# Patient Record
Sex: Male | Born: 1950
Health system: Southern US, Community
[De-identification: ages and names within clinical notes are randomized; demographics above are authoritative.]

## PROBLEM LIST (undated history)

## (undated) DIAGNOSIS — R55 Syncope and collapse: Secondary | ICD-10-CM

## (undated) DIAGNOSIS — E785 Hyperlipidemia, unspecified: Secondary | ICD-10-CM

## (undated) DIAGNOSIS — I10 Essential (primary) hypertension: Secondary | ICD-10-CM

## (undated) HISTORY — DX: Essential (primary) hypertension: I10

## (undated) HISTORY — DX: Syncope and collapse: R55

## (undated) HISTORY — DX: Hyperlipidemia, unspecified: E78.5

---

## 2005-10-30 ENCOUNTER — Other Ambulatory Visit: Admission: RE | Admit: 2005-10-30 | Discharge: 2005-10-30 | Payer: Self-pay | Admitting: Family Medicine

## 2011-01-14 ENCOUNTER — Other Ambulatory Visit: Payer: Self-pay | Admitting: Family Medicine

## 2011-01-14 DIAGNOSIS — E041 Nontoxic single thyroid nodule: Secondary | ICD-10-CM

## 2011-01-16 ENCOUNTER — Ambulatory Visit
Admission: RE | Admit: 2011-01-16 | Discharge: 2011-01-16 | Disposition: A | Discharge status: Home / Self Care | Payer: 59 | Source: Ambulatory Visit | Attending: Family Medicine | Admitting: Family Medicine

## 2011-01-16 DIAGNOSIS — E041 Nontoxic single thyroid nodule: Secondary | ICD-10-CM

## 2014-12-11 ENCOUNTER — Ambulatory Visit
Admission: RE | Admit: 2014-12-11 | Discharge: 2014-12-11 | Disposition: A | Discharge status: Home / Self Care | Payer: Managed Care, Other (non HMO) | Source: Ambulatory Visit | Attending: Sports Medicine | Admitting: Sports Medicine

## 2014-12-11 ENCOUNTER — Ambulatory Visit (INDEPENDENT_AMBULATORY_CARE_PROVIDER_SITE_OTHER): Payer: Managed Care, Other (non HMO) | Admitting: Sports Medicine

## 2014-12-11 ENCOUNTER — Encounter: Payer: Self-pay | Admitting: Sports Medicine

## 2014-12-11 VITALS — BP 144/83 | Ht 72.0 in | Wt 168.0 lb

## 2014-12-11 DIAGNOSIS — M25571 Pain in right ankle and joints of right foot: Secondary | ICD-10-CM | POA: Diagnosis not present

## 2014-12-11 DIAGNOSIS — M25572 Pain in left ankle and joints of left foot: Secondary | ICD-10-CM

## 2014-12-11 MED ORDER — NITROGLYCERIN 0.2 MG/HR TD PT24: MEDICATED_PATCH | TRANSDERMAL | Status: DC
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
## 2015-01-02 ENCOUNTER — Ambulatory Visit (INDEPENDENT_AMBULATORY_CARE_PROVIDER_SITE_OTHER): Payer: Managed Care, Other (non HMO) | Admitting: Sports Medicine

## 2015-01-02 ENCOUNTER — Encounter: Payer: Self-pay | Admitting: Sports Medicine

## 2015-01-02 VITALS — BP 126/73 | HR 56 | Ht 72.0 in | Wt 168.0 lb

## 2015-01-02 DIAGNOSIS — S86312D Strain of muscle(s) and tendon(s) of peroneal muscle group at lower leg level, left leg, subsequent encounter: Secondary | ICD-10-CM | POA: Diagnosis not present

## 2015-01-02 NOTE — Progress Notes (Signed)
   Subjective:    Patient ID: Richard Hicks, male    DOB: 07-Apr-1951, 64 y.o.   MRN: [id number]  HPI   Patient comes in today for follow-up on a left ankle peroneal tendon strain. Overall his symptoms have not worsened. He has noticed some benefit with the body helix compression sleeve as well as with the lateral post. He has been using his topical nitroglycerin and tolerating it without difficulty. He does admit that he has not been doing as much running as he would like due to reasons other than foot pain but overall feels like he is improving.    Review of Systems     Objective:   Physical Exam Well-developed, well-nourished. No acute distress  Left ankle: There is still slight tenderness to palpation at the distal peroneal tendon. No soft tissue swelling. Mild reproducible pain with passive inversion of the foot and ankle. Neurovascularly intact distally.  Prior x-rays of the left ankle were unremarkable. No accessory bone seen.       Assessment & Plan:  Left foot pain secondary to peroneal tendon strain.  Reassurance regarding his x-rays. I would like for him to continue with his body helix compression sleeve and I have provided him with a new pair of green sports insoles with a lateral post on the left. I would like for him to start doing modified Alfredson heel drop exercises with the foot everted to focus the rehabilitation on the peroneal tendon. I want him to continue with his nitroglycerin patches and follow-up with me again in 6 weeks. He will call with questions or concerns prior to his follow-up visit.

## 2015-02-13 ENCOUNTER — Encounter: Payer: Self-pay | Admitting: Sports Medicine

## 2015-02-13 ENCOUNTER — Ambulatory Visit (INDEPENDENT_AMBULATORY_CARE_PROVIDER_SITE_OTHER): Payer: Managed Care, Other (non HMO) | Admitting: Sports Medicine

## 2015-02-13 VITALS — BP 133/77 | HR 54 | Ht 72.0 in | Wt 168.0 lb

## 2015-02-13 DIAGNOSIS — S86312D Strain of muscle(s) and tendon(s) of peroneal muscle group at lower leg level, left leg, subsequent encounter: Secondary | ICD-10-CM | POA: Diagnosis not present

## 2015-02-13 DIAGNOSIS — M79674 Pain in right toe(s): Secondary | ICD-10-CM

## 2015-02-13 DIAGNOSIS — S86312A Strain of muscle(s) and tendon(s) of peroneal muscle group at lower leg level, left leg, initial encounter: Secondary | ICD-10-CM | POA: Insufficient documentation
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
## 2015-02-13 NOTE — Assessment & Plan Note (Signed)
Improved with nitro patches, left lateral post insert and green sport insole. Unable to perform continued exercises as he hurt his right great toe. He did report today that he was stared on the nitro patches for 3 months in January. As ultrasound did show some irregularities still in the peroneal brevis tendon we will continue nitro patches x 1 more month. F/u around 6 weeks.

## 2015-03-20 ENCOUNTER — Ambulatory Visit: Payer: Managed Care, Other (non HMO) | Admitting: Sports Medicine

## 2015-03-26 ENCOUNTER — Ambulatory Visit: Payer: Managed Care, Other (non HMO) | Admitting: Sports Medicine

## 2015-04-12 DIAGNOSIS — L821 Other seborrheic keratosis: Secondary | ICD-10-CM | POA: Diagnosis not present

## 2015-04-12 DIAGNOSIS — D225 Melanocytic nevi of trunk: Secondary | ICD-10-CM | POA: Diagnosis not present

## 2015-04-12 DIAGNOSIS — Z872 Personal history of diseases of the skin and subcutaneous tissue: Secondary | ICD-10-CM | POA: Diagnosis not present

## 2015-05-29 ENCOUNTER — Ambulatory Visit (INDEPENDENT_AMBULATORY_CARE_PROVIDER_SITE_OTHER): Payer: Medicare Other | Admitting: Sports Medicine

## 2015-05-29 ENCOUNTER — Encounter: Payer: Self-pay | Admitting: Sports Medicine

## 2015-05-29 ENCOUNTER — Ambulatory Visit
Admission: RE | Admit: 2015-05-29 | Discharge: 2015-05-29 | Disposition: A | Discharge status: Home / Self Care | Payer: Medicare Other | Source: Ambulatory Visit | Attending: Sports Medicine | Admitting: Sports Medicine

## 2015-05-29 VITALS — BP 151/76 | Ht 72.0 in | Wt 172.0 lb

## 2015-05-29 DIAGNOSIS — M545 Low back pain, unspecified: Secondary | ICD-10-CM

## 2015-05-29 DIAGNOSIS — S86312D Strain of muscle(s) and tendon(s) of peroneal muscle group at lower leg level, left leg, subsequent encounter: Secondary | ICD-10-CM

## 2015-05-29 DIAGNOSIS — M47816 Spondylosis without myelopathy or radiculopathy, lumbar region: Secondary | ICD-10-CM | POA: Diagnosis not present

## 2015-05-29 NOTE — Progress Notes (Signed)
   Subjective:    Patient ID: Richard Hicks, male    DOB: 01-24-1951, 64 y.o.   MRN: [id number]  HPI   Patient comes in today with persistent lateral left ankle pain. Previous x-rays were unremarkable. Previous ultrasound showed fluid around the peroneal tendons consistent with tendinitis/tendinopathy. He has been treated with 4 months of topical nitroglycerin, body helix compression sleeve, and a home exercise program. He has also been treated with a fifth ray post in the inserts of his shoes. All of this has helped somewhat but he still experiences increasing pain as he is active. Pain is worse when he is on his feet or walking for long periods of time. He has not noticed any swelling. He denies numbness and tingling.  He is also complaining of long-standing low back pain. It too is worse with activity. He notices it with prolonged standing or walking. He also notices it when performing certain exercises for his core. Pain will radiate at times into the buttocks and into the proximal hamstrings but he denies radiating pain past the knee. He denies associated numbness or tingling. He does have a history of a prior discectomy for a lumbar disc herniation. His current symptoms are different in nature than what he experienced with that injury. He notices that lumbar extension seems to exacerbate his pain and forward flexion seems to alleviate it somewhat. No change in bowel or bladder.    Review of Systems    as above Objective:   Physical Exam Well-developed, well-nourished. No acute distress. Awake alert and oriented 3. Vital signs reviewed.  Lumbar spine: Good lumbar mobility. Pain with extension. No palpable tenderness. No spasm. Negative straight leg raise bilaterally. No focal neurological deficits of either lower extremity.  Left ankle: Full range of motion. No effusion. Mild soft tissue swelling along the lateral ankle with tenderness to palpation along the course of the peroneal  tendons. He continues to have tenderness to palpation over the peroneal tubercle as well. Neurovascularly intact distally.       Assessment & Plan:  Chronic lateral left ankle pain secondary to peroneal tendinopathy versus peroneal tendon tear Low back pain likely secondary to facet arthropathy  MRI of the left ankle specifically to rule out a tear of the peroneal tendons. X-rays of the lumbar spine to evaluate degree of degenerative disc disease and facet arthropathy. Phone follow-up after both of these studies to discuss the results. I did discuss the possibility of facet injections if his low back pain warrants it. We will delineate further treatment for both his left ankle and his low back after reviewing the imaging studies.

## 2015-05-30 ENCOUNTER — Telehealth: Payer: Self-pay | Admitting: Sports Medicine

## 2015-05-30 ENCOUNTER — Other Ambulatory Visit: Payer: Self-pay | Admitting: Sports Medicine

## 2015-05-30 NOTE — Telephone Encounter (Signed)
I spoke with Richard Hicks on the phone today after reviewing the x-rays of his lumbar spine. There is pretty pronounced disc space narrowing at L5-S1 with concomitant facet arthropathy. Findings are consistent with degenerative disc disease at the L5-S1 level and facet arthropathy. I would like for him to try a little bit of physical therapy and modify his activities based on pain. If he fails 6 weeks of conservative treatment with his lumbar spine then I would consider an MRI in anticipation of diagnostic/therapeutic epidural steroid injections versus facet injections. I will be contacting him next week to discuss the MRI findings of his left ankle.

## 2015-06-11 ENCOUNTER — Ambulatory Visit
Admission: RE | Admit: 2015-06-11 | Discharge: 2015-06-11 | Disposition: A | Discharge status: Home / Self Care | Payer: Managed Care, Other (non HMO) | Source: Ambulatory Visit | Attending: Sports Medicine | Admitting: Sports Medicine

## 2015-06-11 DIAGNOSIS — M545 Low back pain, unspecified: Secondary | ICD-10-CM

## 2015-06-11 DIAGNOSIS — S86312D Strain of muscle(s) and tendon(s) of peroneal muscle group at lower leg level, left leg, subsequent encounter: Secondary | ICD-10-CM

## 2015-06-14 ENCOUNTER — Encounter: Payer: Self-pay | Admitting: Sports Medicine

## 2015-06-14 ENCOUNTER — Ambulatory Visit (INDEPENDENT_AMBULATORY_CARE_PROVIDER_SITE_OTHER): Payer: Managed Care, Other (non HMO) | Admitting: Sports Medicine

## 2015-06-14 VITALS — BP 128/78 | Ht 72.0 in | Wt 172.0 lb

## 2015-06-14 DIAGNOSIS — S86312D Strain of muscle(s) and tendon(s) of peroneal muscle group at lower leg level, left leg, subsequent encounter: Secondary | ICD-10-CM | POA: Diagnosis not present

## 2015-06-14 MED ORDER — NITROGLYCERIN 0.2 MG/HR TD PT24: MEDICATED_PATCH | TRANSDERMAL | Status: DC

## 2015-06-14 NOTE — Progress Notes (Signed)
Patient ID: Richard Hicks, male   DOB: Jul 06, 1951, 64 y.o.   MRN: [id number]   Patient comes in at my request to discuss MRI findings of his left ankle. MRI shows moderate tendinosis of the peroneal longus tendon with a complex tear in both the longitudinal and transverse planes just distal to the lateral malleolus. There is reactive subcortical bone marrow edema in this area as well. I would like to try the patient in a custom orthotic with a lateral heel wedge. My hope is that this will correct his supination more than the green inserts and lateral posting which we have done previously. I also want him to resume his nitroglycerin using a quarter patch daily. He will continue with his body helix compression sleeve as well. We will forego the modified Alfredson heel drop exercises as he was aggravating his first MTP osteoarthritis with these. Patient will return to the office in 4 weeks for an ultrasound. I also explained to him that we could add lateral heel wedges to his other shoe inserts (like his golf shoes) if needed.  Total time spent with the patient was 30 minutes with greater than 50% of the time spent in face-to-face consultation discussing orthotic construction, instruction, and fitting. Patient found orthotics comfortable prior to leaving the office. Evaluation of his gait showed good correction of his supination.  We also briefly discussed his low back x-rays. X-rays show both degenerative disc disease and facet arthropathy. He is going to start some physical therapy at [facility]. He will let me know if I need to fax them a prescription.  Patient was fitted for a : standard, cushioned, semi-rigid orthotic. The orthotic was heated and afterward the patient stood on the orthotic blank positioned on the orthotic stand. The patient was positioned in subtalar neutral position and 10 degrees of ankle dorsiflexion in a weight bearing stance. After completion of molding, a stable base was  applied to the orthotic blank. The blank was ground to a stable position for weight bearing. Size: 9 Base: blue EVA Posting: B/L lateral heel wedges Additional orthotic padding: none

## 2015-06-18 ENCOUNTER — Other Ambulatory Visit: Payer: Self-pay | Admitting: *Deleted

## 2015-06-18 DIAGNOSIS — M545 Low back pain, unspecified: Secondary | ICD-10-CM

## 2015-06-18 NOTE — Addendum Note (Signed)
Addended by: Cyd Silence on: 06/18/2015 01:42 PM   Modules accepted: Orders

## 2015-07-09 DIAGNOSIS — M545 Low back pain: Secondary | ICD-10-CM | POA: Diagnosis not present

## 2015-07-09 DIAGNOSIS — R293 Abnormal posture: Secondary | ICD-10-CM | POA: Diagnosis not present

## 2015-07-09 DIAGNOSIS — M6281 Muscle weakness (generalized): Secondary | ICD-10-CM | POA: Diagnosis not present

## 2015-07-11 DIAGNOSIS — R293 Abnormal posture: Secondary | ICD-10-CM | POA: Diagnosis not present

## 2015-07-11 DIAGNOSIS — M545 Low back pain: Secondary | ICD-10-CM | POA: Diagnosis not present

## 2015-07-11 DIAGNOSIS — M6281 Muscle weakness (generalized): Secondary | ICD-10-CM | POA: Diagnosis not present

## 2015-07-16 DIAGNOSIS — M6281 Muscle weakness (generalized): Secondary | ICD-10-CM | POA: Diagnosis not present

## 2015-07-16 DIAGNOSIS — R293 Abnormal posture: Secondary | ICD-10-CM | POA: Diagnosis not present

## 2015-07-16 DIAGNOSIS — M545 Low back pain: Secondary | ICD-10-CM | POA: Diagnosis not present

## 2015-07-18 DIAGNOSIS — M6281 Muscle weakness (generalized): Secondary | ICD-10-CM | POA: Diagnosis not present

## 2015-07-18 DIAGNOSIS — R293 Abnormal posture: Secondary | ICD-10-CM | POA: Diagnosis not present

## 2015-07-18 DIAGNOSIS — M545 Low back pain: Secondary | ICD-10-CM | POA: Diagnosis not present

## 2015-07-23 ENCOUNTER — Ambulatory Visit (INDEPENDENT_AMBULATORY_CARE_PROVIDER_SITE_OTHER): Payer: Managed Care, Other (non HMO) | Admitting: Sports Medicine

## 2015-07-23 ENCOUNTER — Encounter: Payer: Self-pay | Admitting: Sports Medicine

## 2015-07-23 VITALS — BP 132/84 | Ht 72.0 in | Wt 172.0 lb

## 2015-07-23 DIAGNOSIS — R293 Abnormal posture: Secondary | ICD-10-CM | POA: Diagnosis not present

## 2015-07-23 DIAGNOSIS — S86312D Strain of muscle(s) and tendon(s) of peroneal muscle group at lower leg level, left leg, subsequent encounter: Secondary | ICD-10-CM | POA: Diagnosis not present

## 2015-07-23 DIAGNOSIS — M545 Low back pain: Secondary | ICD-10-CM | POA: Diagnosis not present

## 2015-07-23 DIAGNOSIS — M6281 Muscle weakness (generalized): Secondary | ICD-10-CM | POA: Diagnosis not present

## 2015-07-23 NOTE — Progress Notes (Signed)
   Subjective:    Patient ID: Richard Hicks, male    DOB: May 03, 1951, 64 y.o.   MRN: [id number]  HPI   Patient comes in today for follow-up on left foot/ankle pain. Previous MRI showed tendinosis of the peroneal longus tendon with a complex tear in both the longitudinal and transverse planes just distal to the lateral malleolus. During his last office visit we made him custom orthotics with a lateral heel wedge to help correct his supination. He has found these orthotics to be helpful. However, he does not wear them all the time. He prefers to run in his green sports insoles but admits that he has not been doing much running recently. That will change in about a month. He is also using his nitroglycerin patch. Tolerating it without difficulty.    Review of Systems     Objective:   Physical Exam Well-developed, well-nourished. No acute distress  Left foot/ankle: There is still some tenderness to palpation at the peroneal tubercle and along the course of the peroneal tendons here. No soft tissue swelling. No significant pain with resisted foot eversion. Neurovascularly intact distally.  MRI is as above MSK ultrasound was performed today. Limited images through the peroneal tendons were obtained. There is still evidence of a partial tendon tear through the peroneal longus tendon just distal to the lateral malleolus and near the level of the peroneal tubercle.       Assessment & Plan:  Left foot pain secondary to peroneal longus tendinopathy/tear Supination  I'm encouraged by the patient's improvement. I think the lateral heel wedges are making a big difference. I want to add them to his green sports insoles as well. I would like for him to continue with his topical nitroglycerin and follow-up with me again in 6-8 weeks. Call with questions or concerns in the interim.

## 2015-07-25 DIAGNOSIS — M6281 Muscle weakness (generalized): Secondary | ICD-10-CM | POA: Diagnosis not present

## 2015-07-25 DIAGNOSIS — M545 Low back pain: Secondary | ICD-10-CM | POA: Diagnosis not present

## 2015-07-25 DIAGNOSIS — R293 Abnormal posture: Secondary | ICD-10-CM | POA: Diagnosis not present

## 2015-07-30 DIAGNOSIS — M545 Low back pain: Secondary | ICD-10-CM | POA: Diagnosis not present

## 2015-07-30 DIAGNOSIS — M6281 Muscle weakness (generalized): Secondary | ICD-10-CM | POA: Diagnosis not present

## 2015-07-30 DIAGNOSIS — R293 Abnormal posture: Secondary | ICD-10-CM | POA: Diagnosis not present

## 2015-08-01 DIAGNOSIS — M545 Low back pain: Secondary | ICD-10-CM | POA: Diagnosis not present

## 2015-08-01 DIAGNOSIS — M6281 Muscle weakness (generalized): Secondary | ICD-10-CM | POA: Diagnosis not present

## 2015-08-01 DIAGNOSIS — R293 Abnormal posture: Secondary | ICD-10-CM | POA: Diagnosis not present

## 2015-08-15 DIAGNOSIS — M545 Low back pain: Secondary | ICD-10-CM | POA: Diagnosis not present

## 2015-08-15 DIAGNOSIS — R293 Abnormal posture: Secondary | ICD-10-CM | POA: Diagnosis not present

## 2015-08-15 DIAGNOSIS — M6281 Muscle weakness (generalized): Secondary | ICD-10-CM | POA: Diagnosis not present

## 2015-08-22 DIAGNOSIS — M545 Low back pain: Secondary | ICD-10-CM | POA: Diagnosis not present

## 2015-08-22 DIAGNOSIS — R293 Abnormal posture: Secondary | ICD-10-CM | POA: Diagnosis not present

## 2015-08-22 DIAGNOSIS — M6281 Muscle weakness (generalized): Secondary | ICD-10-CM | POA: Diagnosis not present

## 2015-09-03 ENCOUNTER — Encounter: Payer: Self-pay | Admitting: Sports Medicine

## 2015-09-03 ENCOUNTER — Ambulatory Visit (INDEPENDENT_AMBULATORY_CARE_PROVIDER_SITE_OTHER): Payer: Medicare Other | Admitting: Sports Medicine

## 2015-09-03 VITALS — BP 139/80

## 2015-09-03 DIAGNOSIS — S86312D Strain of muscle(s) and tendon(s) of peroneal muscle group at lower leg level, left leg, subsequent encounter: Secondary | ICD-10-CM | POA: Diagnosis not present

## 2015-09-03 NOTE — Progress Notes (Signed)
Subjective:    Patient ID: Terance Hart, male    DOB: 07/31/1951, 65 y.o.   MRN: [id number]  DATRON LOPIANO is a 65 y.o. male presenting on 09/03/2015 for Left Peroneal Tendonitis  HPI  LEFT PERONEAL TENDONITIS, FOLLOW-UP: - Patient is well established at [facility] for this complaint with multiple visits since 12/2014. Interval history dx Left peroneal tendonitis with recurrent chronic injury to this tendon, patient is active runner, has been on NTG since 08/2014 with a few month period off, last imaging with MRI 05/2015 showed chronic tendinosis of peroneal longus tendon with complex tear. Last visit 07/23/15 for same complaint, with continuing topical NTG, custom orthotics with lateral heel wedges for running and follow-up. - Today patient reports he is doing significantly better over past 6 weeks. Currently pain is minimal, no longer bothering him during running, only occasionally has some lateral ankle pain and soreness after a run or day later. He is currently gradually increasing his running mileage without difficulty, with x 1 run weekly, from 3 miles up to 7 with a goal of about 10 max, for half marathon in 10/2015 - Using daily 1/4 NTG patch, occasional Aleve PRN, no icing, no compression - Wears custom orthotics 100% of time, casual and running, does switch shoes - Continue PT was x 2 weekly, then weekly, now every other week, doing home stretching and str exercises regularly - Denies any new injury, trauma, or fall. Denies any active pain, weakness, numbness, tingling, edema, ecchymosis.  Social History   Social History  . Marital Status: Married    Spouse Name: N/A  . Number of Children: N/A  . Years of Education: N/A   Occupational History  . Not on file.   Social History Main Topics  . Smoking status: Never Smoker   . Smokeless tobacco: Not on file  . Alcohol Use: Not on file  . Drug Use: Not on file  . Sexual Activity: Not on file   Other Topics Concern  . Not on  file   Social History Narrative    Review of Systems Per HPI unless specifically indicated above     Objective:    BP 139/80 mmHg  Wt Readings from Last 3 Encounters:  07/23/15 172 lb (78.019 kg)  06/14/15 172 lb (78.019 kg)  05/29/15 172 lb (78.019 kg)    Physical Exam  Constitutional: He appears well-developed and well-nourished. No distress.  Eyes: EOM are normal.  Musculoskeletal:  Left Ankle Inspection: Normal appearance to Right. No edema, ecchymosis. 1/4 NTG patch in place. Palpation: No significant tenderness over lateral malleolus, peroneal tubercle or surrounding structures. ROM: Full AROM. Strength: 5/5 dorsiflexion, plantar flexion, some mild localized tenderness reproduced on plantar flexion with eversion. Neurovascular: Distally intact  Neurological: He is alert.  Skin: Skin is warm and dry. He is not diaphoretic.  Nursing note and vitals reviewed.    No results found for this or any previous visit.    Assessment & Plan:   Problem List Items Addressed This Visit    Strain of peroneal tendon of left foot - Primary    Significantly improved chronic Left peroneal tendonosis.  Plan: 1. Discontinue topical 1/4 NTG patches today. He has completed at >6 month therapy, initially 08/2014 for several months, then repeat course 5 to 06/2015, and again for 1 month in 07/2015. 2. Continue daily custom orthotics for casual and running. Offered to return at any point for new custom orthotics if needed second pair or worn  first pair. 3. May continue other conservative treatments - ice PRN, aleve PRN, PT every other week and home exercises 4. Continue gradual inc running to goal 10 miles weekly, for half marathon 5. No further [place] or imaging today 6. RTC PRN         No orders of the defined types were placed in this encounter.     Follow up plan: Return if symptoms worsen or fail to improve, for Left peroneal tendonitis, future repeat orthotics.  Nobie Putnam, [place], PGY-3  Patient seen and evaluated with the resident. I agree with the plan of care. Patient is doing very well. He has been on the nitroglycerin protocol for several months now. He will discontinue this but will continue with his custom orthotics with lateral heel wedges. This seems to have helped him tremendously. He will continue with his body helix ankle sleeve as needed as well. Continue to increase activity as tolerated and follow-up with me as needed.

## 2015-09-03 NOTE — Assessment & Plan Note (Signed)
Significantly improved chronic Left peroneal tendonosis.  Plan: 1. Discontinue topical 1/4 NTG patches today. He has completed at >6 month therapy, initially 08/2014 for several months, then repeat course 5 to 06/2015, and again for 1 month in 07/2015. 2. Continue daily custom orthotics for casual and running. Offered to return at any point for new custom orthotics if needed second pair or worn first pair. 3. May continue other conservative treatments - ice PRN, aleve PRN, PT every other week and home exercises 4. Continue gradual inc running to goal 10 miles weekly, for half marathon 5. No further [place] or imaging today 6. RTC PRN

## 2015-09-18 DIAGNOSIS — R293 Abnormal posture: Secondary | ICD-10-CM | POA: Diagnosis not present

## 2015-09-18 DIAGNOSIS — M6281 Muscle weakness (generalized): Secondary | ICD-10-CM | POA: Diagnosis not present

## 2015-09-18 DIAGNOSIS — M545 Low back pain: Secondary | ICD-10-CM | POA: Diagnosis not present

## 2015-11-20 DIAGNOSIS — J00 Acute nasopharyngitis [common cold]: Secondary | ICD-10-CM | POA: Diagnosis not present

## 2015-11-20 DIAGNOSIS — J309 Allergic rhinitis, unspecified: Secondary | ICD-10-CM | POA: Diagnosis not present

## 2016-02-11 DIAGNOSIS — Z0000 Encounter for general adult medical examination without abnormal findings: Secondary | ICD-10-CM | POA: Diagnosis not present

## 2016-02-11 DIAGNOSIS — I10 Essential (primary) hypertension: Secondary | ICD-10-CM | POA: Diagnosis not present

## 2016-02-11 DIAGNOSIS — R002 Palpitations: Secondary | ICD-10-CM | POA: Diagnosis not present

## 2016-02-11 DIAGNOSIS — R5382 Chronic fatigue, unspecified: Secondary | ICD-10-CM | POA: Diagnosis not present

## 2016-02-11 DIAGNOSIS — N529 Male erectile dysfunction, unspecified: Secondary | ICD-10-CM | POA: Diagnosis not present

## 2016-02-11 DIAGNOSIS — E7800 Pure hypercholesterolemia, unspecified: Secondary | ICD-10-CM | POA: Diagnosis not present

## 2016-02-13 ENCOUNTER — Encounter: Payer: Self-pay | Admitting: Sports Medicine

## 2016-02-13 ENCOUNTER — Ambulatory Visit (INDEPENDENT_AMBULATORY_CARE_PROVIDER_SITE_OTHER): Payer: Medicare Other | Admitting: Sports Medicine

## 2016-02-13 VITALS — BP 146/80 | Ht 72.0 in | Wt 172.0 lb

## 2016-02-13 DIAGNOSIS — S86312D Strain of muscle(s) and tendon(s) of peroneal muscle group at lower leg level, left leg, subsequent encounter: Secondary | ICD-10-CM | POA: Diagnosis not present

## 2016-02-13 DIAGNOSIS — E7800 Pure hypercholesterolemia, unspecified: Secondary | ICD-10-CM | POA: Diagnosis not present

## 2016-02-13 DIAGNOSIS — N529 Male erectile dysfunction, unspecified: Secondary | ICD-10-CM | POA: Diagnosis not present

## 2016-02-13 DIAGNOSIS — Z0000 Encounter for general adult medical examination without abnormal findings: Secondary | ICD-10-CM | POA: Diagnosis not present

## 2016-02-13 DIAGNOSIS — R5382 Chronic fatigue, unspecified: Secondary | ICD-10-CM | POA: Diagnosis not present

## 2016-02-13 DIAGNOSIS — R002 Palpitations: Secondary | ICD-10-CM | POA: Diagnosis not present

## 2016-02-13 DIAGNOSIS — I10 Essential (primary) hypertension: Secondary | ICD-10-CM | POA: Diagnosis not present

## 2016-02-13 NOTE — Progress Notes (Signed)
   Subjective:    Patient ID: Richard Hicks, male    DOB: 09-18-1950, 65 y.o.   MRN: [id number]  HPI   Patient presents today with returning lateral left ankle pain. He has a well-documented history of a chronic complex tear of the peroneal longus tendon. He was last seen in our office back in January. Symptoms were tolerable at that time. He was doing well up until a couple of weeks ago when he reinjured himself. Pain has improved over the past 2 weeks. He has resumed wearing the nitroglycerin patches. Pain is identical in nature to what he experienced previously. However, this time it is a little more severe and has led to an inability to run for the past 2 weeks. However, he does feel like his pain is improving.    Review of Systems     Objective:   Physical Exam  Well-developed, well-nourished. No acute distress  Left ankle: Full range of motion. No effusion. No obvious soft tissue swelling. He is tender to palpation along the distal course of the peroneal tendons. Good strength. Neurovascular intact distally.  MSK ultrasound of the left ankle was performed. Images were compared to prior ultrasound images as well as recent MRI. Overall, the scan appears to be unchanged. Once again seen is chronic tearing of the peroneal longus tendon with surrounding fluid.       Assessment & Plan:   Returning lateral left foot pain secondary to chronic complex peroneal longus tendon tear  Patient's symptoms are improving. He'll continue with custom orthotics. Continue with nitroglycerin patches for a total 4 weeks. I did discuss the possibility of a referral to Dr. Doran Durand to discuss surgical options if his symptoms warrant. He will think about this. Follow-up with me as needed.

## 2016-04-15 DIAGNOSIS — Z125 Encounter for screening for malignant neoplasm of prostate: Secondary | ICD-10-CM | POA: Diagnosis not present

## 2016-04-15 DIAGNOSIS — N5201 Erectile dysfunction due to arterial insufficiency: Secondary | ICD-10-CM | POA: Diagnosis not present

## 2016-04-15 DIAGNOSIS — N400 Enlarged prostate without lower urinary tract symptoms: Secondary | ICD-10-CM | POA: Diagnosis not present

## 2016-04-16 DIAGNOSIS — M7672 Peroneal tendinitis, left leg: Secondary | ICD-10-CM | POA: Diagnosis not present

## 2016-04-16 DIAGNOSIS — Q661 Congenital talipes calcaneovarus: Secondary | ICD-10-CM | POA: Diagnosis not present

## 2016-04-23 DIAGNOSIS — M7672 Peroneal tendinitis, left leg: Secondary | ICD-10-CM | POA: Diagnosis not present

## 2016-04-28 DIAGNOSIS — M7672 Peroneal tendinitis, left leg: Secondary | ICD-10-CM | POA: Diagnosis not present

## 2016-05-02 DIAGNOSIS — M7672 Peroneal tendinitis, left leg: Secondary | ICD-10-CM | POA: Diagnosis not present

## 2016-05-05 DIAGNOSIS — M7672 Peroneal tendinitis, left leg: Secondary | ICD-10-CM | POA: Diagnosis not present

## 2016-05-08 DIAGNOSIS — M7672 Peroneal tendinitis, left leg: Secondary | ICD-10-CM | POA: Diagnosis not present

## 2016-05-12 DIAGNOSIS — M7672 Peroneal tendinitis, left leg: Secondary | ICD-10-CM | POA: Diagnosis not present

## 2016-05-15 DIAGNOSIS — M7672 Peroneal tendinitis, left leg: Secondary | ICD-10-CM | POA: Diagnosis not present

## 2016-05-19 DIAGNOSIS — M7672 Peroneal tendinitis, left leg: Secondary | ICD-10-CM | POA: Diagnosis not present

## 2016-05-23 DIAGNOSIS — M7672 Peroneal tendinitis, left leg: Secondary | ICD-10-CM | POA: Diagnosis not present

## 2016-08-19 DIAGNOSIS — H2513 Age-related nuclear cataract, bilateral: Secondary | ICD-10-CM | POA: Diagnosis not present

## 2017-02-18 DIAGNOSIS — M25561 Pain in right knee: Secondary | ICD-10-CM | POA: Diagnosis not present

## 2017-02-18 DIAGNOSIS — M238X1 Other internal derangements of right knee: Secondary | ICD-10-CM | POA: Diagnosis not present

## 2017-03-17 DIAGNOSIS — H16201 Unspecified keratoconjunctivitis, right eye: Secondary | ICD-10-CM | POA: Diagnosis not present

## 2017-03-18 DIAGNOSIS — H1820 Unspecified corneal edema: Secondary | ICD-10-CM | POA: Diagnosis not present

## 2017-04-07 DIAGNOSIS — Z1159 Encounter for screening for other viral diseases: Secondary | ICD-10-CM | POA: Diagnosis not present

## 2017-04-07 DIAGNOSIS — I10 Essential (primary) hypertension: Secondary | ICD-10-CM | POA: Diagnosis not present

## 2017-04-07 DIAGNOSIS — Z23 Encounter for immunization: Secondary | ICD-10-CM | POA: Diagnosis not present

## 2017-04-07 DIAGNOSIS — Z125 Encounter for screening for malignant neoplasm of prostate: Secondary | ICD-10-CM | POA: Diagnosis not present

## 2017-04-07 DIAGNOSIS — Z0000 Encounter for general adult medical examination without abnormal findings: Secondary | ICD-10-CM | POA: Diagnosis not present

## 2017-04-07 DIAGNOSIS — N529 Male erectile dysfunction, unspecified: Secondary | ICD-10-CM | POA: Diagnosis not present

## 2017-04-07 DIAGNOSIS — E7800 Pure hypercholesterolemia, unspecified: Secondary | ICD-10-CM | POA: Diagnosis not present

## 2017-09-15 DIAGNOSIS — Z1211 Encounter for screening for malignant neoplasm of colon: Secondary | ICD-10-CM | POA: Diagnosis not present

## 2017-09-25 DIAGNOSIS — H2513 Age-related nuclear cataract, bilateral: Secondary | ICD-10-CM | POA: Diagnosis not present

## 2018-04-07 DIAGNOSIS — F419 Anxiety disorder, unspecified: Secondary | ICD-10-CM | POA: Diagnosis not present

## 2018-04-13 DIAGNOSIS — Z0000 Encounter for general adult medical examination without abnormal findings: Secondary | ICD-10-CM | POA: Diagnosis not present

## 2018-04-13 DIAGNOSIS — Z125 Encounter for screening for malignant neoplasm of prostate: Secondary | ICD-10-CM | POA: Diagnosis not present

## 2018-04-13 DIAGNOSIS — E7800 Pure hypercholesterolemia, unspecified: Secondary | ICD-10-CM | POA: Diagnosis not present

## 2018-04-13 DIAGNOSIS — Z1159 Encounter for screening for other viral diseases: Secondary | ICD-10-CM | POA: Diagnosis not present

## 2018-04-13 DIAGNOSIS — Z23 Encounter for immunization: Secondary | ICD-10-CM | POA: Diagnosis not present

## 2018-04-13 DIAGNOSIS — I10 Essential (primary) hypertension: Secondary | ICD-10-CM | POA: Diagnosis not present

## 2018-04-13 DIAGNOSIS — N529 Male erectile dysfunction, unspecified: Secondary | ICD-10-CM | POA: Diagnosis not present

## 2018-04-14 DIAGNOSIS — F419 Anxiety disorder, unspecified: Secondary | ICD-10-CM | POA: Diagnosis not present

## 2018-04-21 DIAGNOSIS — F419 Anxiety disorder, unspecified: Secondary | ICD-10-CM | POA: Diagnosis not present

## 2018-04-28 DIAGNOSIS — F419 Anxiety disorder, unspecified: Secondary | ICD-10-CM | POA: Diagnosis not present

## 2018-05-05 DIAGNOSIS — F419 Anxiety disorder, unspecified: Secondary | ICD-10-CM | POA: Diagnosis not present

## 2018-05-12 DIAGNOSIS — F419 Anxiety disorder, unspecified: Secondary | ICD-10-CM | POA: Diagnosis not present

## 2018-05-19 DIAGNOSIS — F419 Anxiety disorder, unspecified: Secondary | ICD-10-CM | POA: Diagnosis not present

## 2018-05-24 DIAGNOSIS — M2578 Osteophyte, vertebrae: Secondary | ICD-10-CM | POA: Diagnosis not present

## 2018-05-24 DIAGNOSIS — M47816 Spondylosis without myelopathy or radiculopathy, lumbar region: Secondary | ICD-10-CM | POA: Diagnosis not present

## 2018-05-24 DIAGNOSIS — M438X9 Other specified deforming dorsopathies, site unspecified: Secondary | ICD-10-CM | POA: Diagnosis not present

## 2018-05-28 DIAGNOSIS — F419 Anxiety disorder, unspecified: Secondary | ICD-10-CM | POA: Diagnosis not present

## 2018-06-02 DIAGNOSIS — F419 Anxiety disorder, unspecified: Secondary | ICD-10-CM | POA: Diagnosis not present

## 2018-06-07 DIAGNOSIS — R55 Syncope and collapse: Secondary | ICD-10-CM | POA: Diagnosis not present

## 2018-06-07 DIAGNOSIS — I10 Essential (primary) hypertension: Secondary | ICD-10-CM | POA: Diagnosis not present

## 2018-06-10 DIAGNOSIS — F419 Anxiety disorder, unspecified: Secondary | ICD-10-CM | POA: Diagnosis not present

## 2018-06-17 DIAGNOSIS — F419 Anxiety disorder, unspecified: Secondary | ICD-10-CM | POA: Diagnosis not present

## 2018-06-22 DIAGNOSIS — F419 Anxiety disorder, unspecified: Secondary | ICD-10-CM | POA: Diagnosis not present

## 2018-06-30 DIAGNOSIS — F419 Anxiety disorder, unspecified: Secondary | ICD-10-CM | POA: Diagnosis not present

## 2018-07-02 ENCOUNTER — Encounter: Payer: Self-pay | Admitting: Internal Medicine

## 2018-07-02 ENCOUNTER — Ambulatory Visit (INDEPENDENT_AMBULATORY_CARE_PROVIDER_SITE_OTHER): Payer: Medicare Other | Admitting: Internal Medicine

## 2018-07-02 VITALS — BP 150/84 | HR 58 | Ht 72.0 in | Wt 177.2 lb

## 2018-07-02 DIAGNOSIS — I10 Essential (primary) hypertension: Secondary | ICD-10-CM

## 2018-07-02 DIAGNOSIS — E782 Mixed hyperlipidemia: Secondary | ICD-10-CM | POA: Diagnosis not present

## 2018-07-02 DIAGNOSIS — R55 Syncope and collapse: Secondary | ICD-10-CM | POA: Insufficient documentation

## 2018-07-02 MED ORDER — IRBESARTAN 75 MG PO TABS: 75.0000 mg | ORAL_TABLET | Freq: Every day | ORAL | 3 refills | Status: AC

## 2018-07-02 NOTE — Patient Instructions (Addendum)
Medication Instructions:  TAKE amlodipine START irbesartan 75mg  daily If you need a refill on your cardiac medications before your next appointment, please call your pharmacy.   Lab work: NONE  Testing/Procedures: NONE  Follow-Up: At [facility], you and your health needs are our priority.  As part of our continuing mission to provide you with exceptional heart care, we have created designated Provider Care Teams.  These Care Teams include your primary Cardiologist (physician) and Advanced Practice Providers (APPs -  Physician Assistants and Nurse Practitioners) who all work together to provide you with the care you need, when you need it. You will need a follow up appointment in 4 weeks.  You may see Dr. Debara Pickett or one of the following Advanced Practice Providers on your designated Care Team: Almyra Deforest, [place] . Fabian Sharp, PA-C  Any Other Special Instructions Will Be Listed Below (If Applicable).  If you monitor your blood pressure (BP) at home, please bring your BP cuff and your BP readings with you to your next appointment  [place] BLOOD PRESSURE:  Rest 5 minutes before taking your blood pressure.  Don't smoke or drink caffeinated beverages for at least 30 minutes before.  Take your blood pressure before (not after) you eat.  Sit comfortably with your back supported and both feet on the floor (don't cross your legs).  Elevate your arm to heart level on a table or a desk.  Use the proper sized cuff. It should fit smoothly and snugly around your bare upper arm. There should be enough room to slip a fingertip under the cuff. The bottom edge of the cuff should be 1 inch above the crease of the elbow.  Ideally, take 3 measurements at one sitting and record the average.

## 2018-07-02 NOTE — Progress Notes (Signed)
OFFICE CONSULT NOTE  Chief Complaint:  Syncope  Primary Care Physician: Richard Cruel, MD  HPI:  Richard Hicks is a 67 y.o. male who is being seen today for the evaluation of syncope at the request of Richard Lass, MD.  This is a pleasant 67 year old male was kindly referred for evaluation of syncope.  Richard Hicks reports that he has had several episodes of syncope over the past for 5 years.  Specifically he can recall 3.  All 3 of which were associated with some alcohol use.  He generally drinks alcohol on a daily basis usually about 3 craft beers.  He does not think that this is excessive.  All 3 episodes seem to be associated with some clear prodrome.  He was noted to have sensitivity to light, felt somewhat dizzy and then ultimately passed out.  The last episode recently was while at a restaurant.  He kind of slumped over the table and then recovered.  There is no chest pain or associated tachycardia.  Although the situation was very similar to the others.  He has been on long-term verapamil for blood pressure control.  He is noted to be bradycardic on exam at 58.  Blood pressure was elevated today.  Recently his primary stopped the verapamil and switch him to amlodipine.  He said he took this for several weeks but did not notice it was working well enough for his blood pressure and then went back to the verapamil.  In the distant past he was on Diovan for blood pressure.  He believes it was discontinued because it was not effective in reaching his blood pressure target.  No significant family history of heart disease.  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  PMHx:  Past Medical History:  Diagnosis Date  . Hyperlipidemia   . Hypertension   . Syncope and collapse     History reviewed. No pertinent surgical history.  FAMHx:  History reviewed. No pertinent family history. No history of syncope or sudden  death.  SOCHx:   reports that he has never smoked. He has never used smokeless tobacco. His alcohol and drug histories are not on file.  ALLERGIES:  No Known Allergies  ROS: Pertinent items noted in HPI and remainder of comprehensive ROS otherwise negative.  HOME MEDS: Current Outpatient Medications on File Prior to Visit  Medication Sig Dispense Refill  . amLODipine (NORVASC) 10 MG tablet      No current facility-administered medications on file prior to visit.     LABS/IMAGING: No results found for this or any previous visit (from the past 48 hour(s)). No results found.  LIPID PANEL: No results found for: CHOL, TRIG, HDL, CHOLHDL, VLDL, LDLCALC, LDLDIRECT  WEIGHTS: Wt Readings from Last 3 Encounters:  07/02/18 177 lb 3.2 oz (80.4 kg)  02/13/16 172 lb (78 kg)  07/23/15 172 lb (78 kg)    VITALS: BP (!) 150/84   Pulse (!) 58   Ht 6' (1.829 m)   Wt 177 lb 3.2 oz (80.4 kg)   BMI 24.03 kg/m   EXAM: General appearance: alert, no distress and thin Neck: no carotid bruit, no JVD and thyroid not enlarged, symmetric, no tenderness/mass/nodules Lungs: clear to auscultation bilaterally Heart: regular rate and rhythm Abdomen: soft, non-tender; bowel sounds normal; no masses,  no organomegaly Extremities: extremities normal, atraumatic, no cyanosis or edema Pulses: 2+ and symmetric Skin: Skin color, texture,  turgor normal. No rashes or lesions Neurologic: Grossly normal Psych: Pleasant  EKG: Sinus bradycardia first-degree AV block at 58-personally reviewed- personally reviewed  ASSESSMENT: 1. Vasovagal syncope 2. Hypertension  PLAN: 1.   [name] is likely describing vasovagal syncope.  He has had 3 episodes all of which may been alcohol related.  This could be vasodepressor in the setting of using high-dose calcium channel blocker.  I agree with the plan to stop verapamil and believe switching to amlodipine makes sense although he may not of given the medicine  enough time or perhaps needs additional therapy to reach target blood pressure.  We will plan to switch him to amlodipine 10 mg daily, stop his high-dose verapamil and add low-dose irbesartan for additional blood pressure benefit.  Follow-up with me in 1 month with a list of his blood pressures and blood pressure cuff and I encouraged him also to decrease alcohol intake.  Thanks for the kind referral.  Richard Casino, MD, FACC, [place] Director of the Advanced Lipid Disorders &  Cardiovascular Risk Reduction Clinic Diplomate of the American Board of Clinical Lipidology Attending Cardiologist  Direct Dial: [phone]  Fax: [phone]  Website:  [email or web address] 07/02/2018, 4:37 PM

## 2018-07-07 DIAGNOSIS — F419 Anxiety disorder, unspecified: Secondary | ICD-10-CM | POA: Diagnosis not present

## 2018-07-15 DIAGNOSIS — F419 Anxiety disorder, unspecified: Secondary | ICD-10-CM | POA: Diagnosis not present

## 2018-07-22 DIAGNOSIS — F419 Anxiety disorder, unspecified: Secondary | ICD-10-CM | POA: Diagnosis not present

## 2018-07-29 DIAGNOSIS — F419 Anxiety disorder, unspecified: Secondary | ICD-10-CM | POA: Diagnosis not present

## 2018-07-30 ENCOUNTER — Encounter: Payer: Self-pay | Admitting: Internal Medicine

## 2018-07-30 ENCOUNTER — Ambulatory Visit (INDEPENDENT_AMBULATORY_CARE_PROVIDER_SITE_OTHER): Payer: Medicare Other | Admitting: Internal Medicine

## 2018-07-30 VITALS — BP 122/64 | HR 64 | Ht 72.0 in | Wt 179.4 lb

## 2018-07-30 DIAGNOSIS — I10 Essential (primary) hypertension: Secondary | ICD-10-CM | POA: Diagnosis not present

## 2018-07-30 DIAGNOSIS — R55 Syncope and collapse: Secondary | ICD-10-CM | POA: Diagnosis not present

## 2018-07-30 NOTE — Patient Instructions (Signed)
Medication Instructions:  NO changes  Follow-Up: At [facility], you and your health needs are our priority.  As part of our continuing mission to provide you with exceptional heart care, we have created designated Provider Care Teams.  These Care Teams include your primary Cardiologist (physician) and Advanced Practice Providers (APPs -  Physician Assistants and Nurse Practitioners) who all work together to provide you with the care you need, when you need it. You will need a follow up appointment as needed. If you need an appointment, you may see Dr. Debara Pickett or one of the following Advanced Practice Providers on your designated Care Team: Almyra Deforest, [place] . Fabian Sharp, PA-C

## 2018-07-30 NOTE — Progress Notes (Signed)
OFFICE CONSULT NOTE  Chief Complaint:  Syncope  Primary Care Physician: Lawerance Cruel, MD  HPI:  Richard Hicks is a 67 y.o. male who is being seen today for the evaluation of syncope at the request of Lawerance Cruel, MD.  This is a pleasant 67 year old male was kindly referred for evaluation of syncope.  Richard Hicks reports that he has had several episodes of syncope over the past for 5 years.  Specifically he can recall 3.  All 3 of which were associated with some alcohol use.  He generally drinks alcohol on a daily basis usually about 3 craft beers.  He does not think that this is excessive.  All 3 episodes seem to be associated with some clear prodrome.  He was noted to have sensitivity to light, felt somewhat dizzy and then ultimately passed out.  The last episode recently was while at a restaurant.  He kind of slumped over the table and then recovered.  There is no chest pain or associated tachycardia.  Although the situation was very similar to the others.  He has been on long-term verapamil for blood pressure control.  He is noted to be bradycardic on exam at 58.  Blood pressure was elevated today.  Recently his primary stopped the verapamil and switch him to amlodipine.  He said he took this for several weeks but did not notice it was working well enough for his blood pressure and then went back to the verapamil.  In the distant past he was on Diovan for blood pressure.  He believes it was discontinued because it was not effective in reaching his blood pressure target.  No significant family history of heart disease.  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  07/30/2018  Richard Hicks is seen today in follow-up.  Overall he is doing very well.  His blood pressure is now much better controlled at 122/64.  He has had no further syncopal episodes and gets very infrequent dizziness with quick change in position.   He says he feels well.  PMHx:  Past Medical History:  Diagnosis Date  . Hyperlipidemia   . Hypertension   . Syncope and collapse     No past surgical history on file.  FAMHx:  No family history on file. No history of syncope or sudden death.  SOCHx:   reports that he has never smoked. He has never used smokeless tobacco. No history on file for alcohol and drug.  ALLERGIES:  No Known Allergies  ROS: Pertinent items noted in HPI and remainder of comprehensive ROS otherwise negative.  HOME MEDS: Current Outpatient Medications on File Prior to Visit  Medication Sig Dispense Refill  . amLODipine (NORVASC) 10 MG tablet     . irbesartan (AVAPRO) 75 MG tablet Take 1 tablet (75 mg total) by mouth daily. 90 tablet 3   No current facility-administered medications on file prior to visit.     LABS/IMAGING: No results found for this or any previous visit (from the past 48 hour(s)). No results found.  LIPID PANEL: No results found for: CHOL, TRIG, HDL, CHOLHDL, VLDL, LDLCALC, LDLDIRECT  WEIGHTS: Wt Readings from Last 3 Encounters:  07/30/18 179 lb 6.4 oz (81.4 kg)  07/02/18 177 lb 3.2 oz (80.4 kg)  02/13/16 172 lb (78 kg)    VITALS: BP 122/64   Pulse 64   Ht 6' (1.829 m)   Wt 179  lb 6.4 oz (81.4 kg)   BMI 24.33 kg/m   EXAM: Deferred  EKG: Deferred  ASSESSMENT: 1. Vasovagal syncope 2. Hypertension  PLAN: 1.   Richard Hicks has done very well off verapamil with changes in his medications.  His blood pressure is better controlled and is less symptomatic.  He has had no further syncopal episodes.  I do believe these were vasovagal syncopal episodes.  He should continue to try to avoid alcohol.  I encouraged regular exercise.  Hopefully his PCP can continue to prescribe his blood pressure medications.  From a cardiac standpoint he can follow-up with me as needed.  Pixie Casino, MD, [facility], [place] Director of the Advanced Lipid  Disorders &  Cardiovascular Risk Reduction Clinic Diplomate of the American Board of Clinical Lipidology Attending Cardiologist  Direct Dial: [phone]  Fax: [phone]  Website:  [email or web address] 07/30/2018, 4:01 PM

## 2018-08-19 DIAGNOSIS — F419 Anxiety disorder, unspecified: Secondary | ICD-10-CM | POA: Diagnosis not present

## 2018-08-25 DIAGNOSIS — F419 Anxiety disorder, unspecified: Secondary | ICD-10-CM | POA: Diagnosis not present

## 2018-09-01 DIAGNOSIS — F419 Anxiety disorder, unspecified: Secondary | ICD-10-CM | POA: Diagnosis not present

## 2018-09-08 DIAGNOSIS — F419 Anxiety disorder, unspecified: Secondary | ICD-10-CM | POA: Diagnosis not present

## 2018-09-15 DIAGNOSIS — F419 Anxiety disorder, unspecified: Secondary | ICD-10-CM | POA: Diagnosis not present

## 2018-09-16 DIAGNOSIS — M47812 Spondylosis without myelopathy or radiculopathy, cervical region: Secondary | ICD-10-CM | POA: Diagnosis not present

## 2018-09-21 DIAGNOSIS — M545 Low back pain: Secondary | ICD-10-CM | POA: Diagnosis not present

## 2018-09-21 DIAGNOSIS — M5136 Other intervertebral disc degeneration, lumbar region: Secondary | ICD-10-CM | POA: Diagnosis not present

## 2018-09-21 DIAGNOSIS — M5417 Radiculopathy, lumbosacral region: Secondary | ICD-10-CM | POA: Diagnosis not present

## 2018-09-22 DIAGNOSIS — F419 Anxiety disorder, unspecified: Secondary | ICD-10-CM | POA: Diagnosis not present

## 2018-09-29 DIAGNOSIS — F419 Anxiety disorder, unspecified: Secondary | ICD-10-CM | POA: Diagnosis not present

## 2018-10-01 DIAGNOSIS — M545 Low back pain: Secondary | ICD-10-CM | POA: Diagnosis not present

## 2018-10-05 DIAGNOSIS — M545 Low back pain: Secondary | ICD-10-CM | POA: Diagnosis not present

## 2018-10-06 DIAGNOSIS — F419 Anxiety disorder, unspecified: Secondary | ICD-10-CM | POA: Diagnosis not present

## 2018-10-08 DIAGNOSIS — M545 Low back pain: Secondary | ICD-10-CM | POA: Diagnosis not present

## 2018-10-12 DIAGNOSIS — M545 Low back pain: Secondary | ICD-10-CM | POA: Diagnosis not present

## 2018-10-13 DIAGNOSIS — F419 Anxiety disorder, unspecified: Secondary | ICD-10-CM | POA: Diagnosis not present

## 2018-10-15 DIAGNOSIS — M545 Low back pain: Secondary | ICD-10-CM | POA: Diagnosis not present

## 2018-10-19 DIAGNOSIS — M545 Low back pain: Secondary | ICD-10-CM | POA: Diagnosis not present

## 2018-10-20 DIAGNOSIS — F419 Anxiety disorder, unspecified: Secondary | ICD-10-CM | POA: Diagnosis not present

## 2018-10-22 DIAGNOSIS — R05 Cough: Secondary | ICD-10-CM | POA: Diagnosis not present

## 2018-10-29 DIAGNOSIS — M545 Low back pain: Secondary | ICD-10-CM | POA: Diagnosis not present

## 2018-11-10 DIAGNOSIS — M545 Low back pain: Secondary | ICD-10-CM | POA: Diagnosis not present

## 2018-11-17 DIAGNOSIS — M545 Low back pain: Secondary | ICD-10-CM | POA: Diagnosis not present

## 2018-11-22 DIAGNOSIS — M48061 Spinal stenosis, lumbar region without neurogenic claudication: Secondary | ICD-10-CM | POA: Diagnosis not present

## 2018-11-22 DIAGNOSIS — M5136 Other intervertebral disc degeneration, lumbar region: Secondary | ICD-10-CM | POA: Diagnosis not present

## 2018-12-09 DIAGNOSIS — F419 Anxiety disorder, unspecified: Secondary | ICD-10-CM | POA: Diagnosis not present

## 2018-12-15 DIAGNOSIS — F419 Anxiety disorder, unspecified: Secondary | ICD-10-CM | POA: Diagnosis not present

## 2018-12-22 DIAGNOSIS — F419 Anxiety disorder, unspecified: Secondary | ICD-10-CM | POA: Diagnosis not present

## 2018-12-29 DIAGNOSIS — F419 Anxiety disorder, unspecified: Secondary | ICD-10-CM | POA: Diagnosis not present

## 2019-01-05 DIAGNOSIS — F419 Anxiety disorder, unspecified: Secondary | ICD-10-CM | POA: Diagnosis not present

## 2019-01-05 DIAGNOSIS — M9904 Segmental and somatic dysfunction of sacral region: Secondary | ICD-10-CM | POA: Diagnosis not present

## 2019-01-05 DIAGNOSIS — M9901 Segmental and somatic dysfunction of cervical region: Secondary | ICD-10-CM | POA: Diagnosis not present

## 2019-01-05 DIAGNOSIS — M5431 Sciatica, right side: Secondary | ICD-10-CM | POA: Diagnosis not present

## 2019-01-05 DIAGNOSIS — M545 Low back pain: Secondary | ICD-10-CM | POA: Diagnosis not present

## 2019-01-05 DIAGNOSIS — M62830 Muscle spasm of back: Secondary | ICD-10-CM | POA: Diagnosis not present

## 2019-01-05 DIAGNOSIS — M9903 Segmental and somatic dysfunction of lumbar region: Secondary | ICD-10-CM | POA: Diagnosis not present

## 2019-01-06 DIAGNOSIS — M9903 Segmental and somatic dysfunction of lumbar region: Secondary | ICD-10-CM | POA: Diagnosis not present

## 2019-01-06 DIAGNOSIS — M9901 Segmental and somatic dysfunction of cervical region: Secondary | ICD-10-CM | POA: Diagnosis not present

## 2019-01-06 DIAGNOSIS — M5431 Sciatica, right side: Secondary | ICD-10-CM | POA: Diagnosis not present

## 2019-01-06 DIAGNOSIS — M545 Low back pain: Secondary | ICD-10-CM | POA: Diagnosis not present

## 2019-01-06 DIAGNOSIS — M9904 Segmental and somatic dysfunction of sacral region: Secondary | ICD-10-CM | POA: Diagnosis not present

## 2019-01-06 DIAGNOSIS — M62830 Muscle spasm of back: Secondary | ICD-10-CM | POA: Diagnosis not present

## 2019-01-10 DIAGNOSIS — M62830 Muscle spasm of back: Secondary | ICD-10-CM | POA: Diagnosis not present

## 2019-01-10 DIAGNOSIS — M5431 Sciatica, right side: Secondary | ICD-10-CM | POA: Diagnosis not present

## 2019-01-10 DIAGNOSIS — M9904 Segmental and somatic dysfunction of sacral region: Secondary | ICD-10-CM | POA: Diagnosis not present

## 2019-01-10 DIAGNOSIS — M9901 Segmental and somatic dysfunction of cervical region: Secondary | ICD-10-CM | POA: Diagnosis not present

## 2019-01-10 DIAGNOSIS — M545 Low back pain: Secondary | ICD-10-CM | POA: Diagnosis not present

## 2019-01-10 DIAGNOSIS — M9903 Segmental and somatic dysfunction of lumbar region: Secondary | ICD-10-CM | POA: Diagnosis not present

## 2019-01-12 DIAGNOSIS — M9904 Segmental and somatic dysfunction of sacral region: Secondary | ICD-10-CM | POA: Diagnosis not present

## 2019-01-12 DIAGNOSIS — M9903 Segmental and somatic dysfunction of lumbar region: Secondary | ICD-10-CM | POA: Diagnosis not present

## 2019-01-12 DIAGNOSIS — F419 Anxiety disorder, unspecified: Secondary | ICD-10-CM | POA: Diagnosis not present

## 2019-01-12 DIAGNOSIS — M5431 Sciatica, right side: Secondary | ICD-10-CM | POA: Diagnosis not present

## 2019-01-12 DIAGNOSIS — M62830 Muscle spasm of back: Secondary | ICD-10-CM | POA: Diagnosis not present

## 2019-01-12 DIAGNOSIS — M9901 Segmental and somatic dysfunction of cervical region: Secondary | ICD-10-CM | POA: Diagnosis not present

## 2019-01-12 DIAGNOSIS — M545 Low back pain: Secondary | ICD-10-CM | POA: Diagnosis not present

## 2019-01-13 DIAGNOSIS — M9904 Segmental and somatic dysfunction of sacral region: Secondary | ICD-10-CM | POA: Diagnosis not present

## 2019-01-13 DIAGNOSIS — M9903 Segmental and somatic dysfunction of lumbar region: Secondary | ICD-10-CM | POA: Diagnosis not present

## 2019-01-13 DIAGNOSIS — M5431 Sciatica, right side: Secondary | ICD-10-CM | POA: Diagnosis not present

## 2019-01-13 DIAGNOSIS — M9901 Segmental and somatic dysfunction of cervical region: Secondary | ICD-10-CM | POA: Diagnosis not present

## 2019-01-13 DIAGNOSIS — M62830 Muscle spasm of back: Secondary | ICD-10-CM | POA: Diagnosis not present

## 2019-01-13 DIAGNOSIS — M545 Low back pain: Secondary | ICD-10-CM | POA: Diagnosis not present

## 2019-01-17 DIAGNOSIS — M62830 Muscle spasm of back: Secondary | ICD-10-CM | POA: Diagnosis not present

## 2019-01-17 DIAGNOSIS — M9903 Segmental and somatic dysfunction of lumbar region: Secondary | ICD-10-CM | POA: Diagnosis not present

## 2019-01-17 DIAGNOSIS — M9901 Segmental and somatic dysfunction of cervical region: Secondary | ICD-10-CM | POA: Diagnosis not present

## 2019-01-17 DIAGNOSIS — M545 Low back pain: Secondary | ICD-10-CM | POA: Diagnosis not present

## 2019-01-17 DIAGNOSIS — M9904 Segmental and somatic dysfunction of sacral region: Secondary | ICD-10-CM | POA: Diagnosis not present

## 2019-01-17 DIAGNOSIS — M5431 Sciatica, right side: Secondary | ICD-10-CM | POA: Diagnosis not present

## 2019-01-19 DIAGNOSIS — M62830 Muscle spasm of back: Secondary | ICD-10-CM | POA: Diagnosis not present

## 2019-01-19 DIAGNOSIS — M9901 Segmental and somatic dysfunction of cervical region: Secondary | ICD-10-CM | POA: Diagnosis not present

## 2019-01-19 DIAGNOSIS — M9904 Segmental and somatic dysfunction of sacral region: Secondary | ICD-10-CM | POA: Diagnosis not present

## 2019-01-19 DIAGNOSIS — F419 Anxiety disorder, unspecified: Secondary | ICD-10-CM | POA: Diagnosis not present

## 2019-01-19 DIAGNOSIS — M9903 Segmental and somatic dysfunction of lumbar region: Secondary | ICD-10-CM | POA: Diagnosis not present

## 2019-01-19 DIAGNOSIS — M5431 Sciatica, right side: Secondary | ICD-10-CM | POA: Diagnosis not present

## 2019-01-19 DIAGNOSIS — M545 Low back pain: Secondary | ICD-10-CM | POA: Diagnosis not present

## 2019-01-20 DIAGNOSIS — M9901 Segmental and somatic dysfunction of cervical region: Secondary | ICD-10-CM | POA: Diagnosis not present

## 2019-01-20 DIAGNOSIS — M62830 Muscle spasm of back: Secondary | ICD-10-CM | POA: Diagnosis not present

## 2019-01-20 DIAGNOSIS — M545 Low back pain: Secondary | ICD-10-CM | POA: Diagnosis not present

## 2019-01-20 DIAGNOSIS — M5431 Sciatica, right side: Secondary | ICD-10-CM | POA: Diagnosis not present

## 2019-01-20 DIAGNOSIS — M9904 Segmental and somatic dysfunction of sacral region: Secondary | ICD-10-CM | POA: Diagnosis not present

## 2019-01-20 DIAGNOSIS — M9903 Segmental and somatic dysfunction of lumbar region: Secondary | ICD-10-CM | POA: Diagnosis not present

## 2019-01-24 DIAGNOSIS — M62830 Muscle spasm of back: Secondary | ICD-10-CM | POA: Diagnosis not present

## 2019-01-24 DIAGNOSIS — M9901 Segmental and somatic dysfunction of cervical region: Secondary | ICD-10-CM | POA: Diagnosis not present

## 2019-01-24 DIAGNOSIS — M545 Low back pain: Secondary | ICD-10-CM | POA: Diagnosis not present

## 2019-01-24 DIAGNOSIS — M5431 Sciatica, right side: Secondary | ICD-10-CM | POA: Diagnosis not present

## 2019-01-24 DIAGNOSIS — M9904 Segmental and somatic dysfunction of sacral region: Secondary | ICD-10-CM | POA: Diagnosis not present

## 2019-01-24 DIAGNOSIS — M9903 Segmental and somatic dysfunction of lumbar region: Secondary | ICD-10-CM | POA: Diagnosis not present

## 2019-01-26 DIAGNOSIS — M62830 Muscle spasm of back: Secondary | ICD-10-CM | POA: Diagnosis not present

## 2019-01-26 DIAGNOSIS — M9903 Segmental and somatic dysfunction of lumbar region: Secondary | ICD-10-CM | POA: Diagnosis not present

## 2019-01-26 DIAGNOSIS — M545 Low back pain: Secondary | ICD-10-CM | POA: Diagnosis not present

## 2019-01-26 DIAGNOSIS — F419 Anxiety disorder, unspecified: Secondary | ICD-10-CM | POA: Diagnosis not present

## 2019-01-26 DIAGNOSIS — M9901 Segmental and somatic dysfunction of cervical region: Secondary | ICD-10-CM | POA: Diagnosis not present

## 2019-01-26 DIAGNOSIS — M9904 Segmental and somatic dysfunction of sacral region: Secondary | ICD-10-CM | POA: Diagnosis not present

## 2019-01-26 DIAGNOSIS — M5431 Sciatica, right side: Secondary | ICD-10-CM | POA: Diagnosis not present

## 2019-01-31 DIAGNOSIS — M62830 Muscle spasm of back: Secondary | ICD-10-CM | POA: Diagnosis not present

## 2019-01-31 DIAGNOSIS — M5431 Sciatica, right side: Secondary | ICD-10-CM | POA: Diagnosis not present

## 2019-01-31 DIAGNOSIS — M545 Low back pain: Secondary | ICD-10-CM | POA: Diagnosis not present

## 2019-01-31 DIAGNOSIS — M9903 Segmental and somatic dysfunction of lumbar region: Secondary | ICD-10-CM | POA: Diagnosis not present

## 2019-01-31 DIAGNOSIS — M9901 Segmental and somatic dysfunction of cervical region: Secondary | ICD-10-CM | POA: Diagnosis not present

## 2019-01-31 DIAGNOSIS — M9904 Segmental and somatic dysfunction of sacral region: Secondary | ICD-10-CM | POA: Diagnosis not present

## 2019-02-02 DIAGNOSIS — F419 Anxiety disorder, unspecified: Secondary | ICD-10-CM | POA: Diagnosis not present

## 2019-02-02 DIAGNOSIS — M9904 Segmental and somatic dysfunction of sacral region: Secondary | ICD-10-CM | POA: Diagnosis not present

## 2019-02-02 DIAGNOSIS — M9901 Segmental and somatic dysfunction of cervical region: Secondary | ICD-10-CM | POA: Diagnosis not present

## 2019-02-02 DIAGNOSIS — M5431 Sciatica, right side: Secondary | ICD-10-CM | POA: Diagnosis not present

## 2019-02-02 DIAGNOSIS — M62830 Muscle spasm of back: Secondary | ICD-10-CM | POA: Diagnosis not present

## 2019-02-02 DIAGNOSIS — M545 Low back pain: Secondary | ICD-10-CM | POA: Diagnosis not present

## 2019-02-02 DIAGNOSIS — M9903 Segmental and somatic dysfunction of lumbar region: Secondary | ICD-10-CM | POA: Diagnosis not present

## 2019-02-07 DIAGNOSIS — M9904 Segmental and somatic dysfunction of sacral region: Secondary | ICD-10-CM | POA: Diagnosis not present

## 2019-02-07 DIAGNOSIS — M62830 Muscle spasm of back: Secondary | ICD-10-CM | POA: Diagnosis not present

## 2019-02-07 DIAGNOSIS — M545 Low back pain: Secondary | ICD-10-CM | POA: Diagnosis not present

## 2019-02-07 DIAGNOSIS — M9903 Segmental and somatic dysfunction of lumbar region: Secondary | ICD-10-CM | POA: Diagnosis not present

## 2019-02-07 DIAGNOSIS — M5431 Sciatica, right side: Secondary | ICD-10-CM | POA: Diagnosis not present

## 2019-02-07 DIAGNOSIS — M9901 Segmental and somatic dysfunction of cervical region: Secondary | ICD-10-CM | POA: Diagnosis not present

## 2019-02-09 DIAGNOSIS — M545 Low back pain: Secondary | ICD-10-CM | POA: Diagnosis not present

## 2019-02-09 DIAGNOSIS — M9901 Segmental and somatic dysfunction of cervical region: Secondary | ICD-10-CM | POA: Diagnosis not present

## 2019-02-09 DIAGNOSIS — M9903 Segmental and somatic dysfunction of lumbar region: Secondary | ICD-10-CM | POA: Diagnosis not present

## 2019-02-09 DIAGNOSIS — M9904 Segmental and somatic dysfunction of sacral region: Secondary | ICD-10-CM | POA: Diagnosis not present

## 2019-02-09 DIAGNOSIS — M5431 Sciatica, right side: Secondary | ICD-10-CM | POA: Diagnosis not present

## 2019-02-09 DIAGNOSIS — M62830 Muscle spasm of back: Secondary | ICD-10-CM | POA: Diagnosis not present

## 2019-02-17 DIAGNOSIS — M62830 Muscle spasm of back: Secondary | ICD-10-CM | POA: Diagnosis not present

## 2019-02-17 DIAGNOSIS — M9901 Segmental and somatic dysfunction of cervical region: Secondary | ICD-10-CM | POA: Diagnosis not present

## 2019-02-17 DIAGNOSIS — M9904 Segmental and somatic dysfunction of sacral region: Secondary | ICD-10-CM | POA: Diagnosis not present

## 2019-02-17 DIAGNOSIS — M9903 Segmental and somatic dysfunction of lumbar region: Secondary | ICD-10-CM | POA: Diagnosis not present

## 2019-02-17 DIAGNOSIS — M545 Low back pain: Secondary | ICD-10-CM | POA: Diagnosis not present

## 2019-02-17 DIAGNOSIS — M5431 Sciatica, right side: Secondary | ICD-10-CM | POA: Diagnosis not present

## 2019-02-23 DIAGNOSIS — M62830 Muscle spasm of back: Secondary | ICD-10-CM | POA: Diagnosis not present

## 2019-02-23 DIAGNOSIS — M545 Low back pain: Secondary | ICD-10-CM | POA: Diagnosis not present

## 2019-02-23 DIAGNOSIS — M9901 Segmental and somatic dysfunction of cervical region: Secondary | ICD-10-CM | POA: Diagnosis not present

## 2019-02-23 DIAGNOSIS — M9904 Segmental and somatic dysfunction of sacral region: Secondary | ICD-10-CM | POA: Diagnosis not present

## 2019-02-23 DIAGNOSIS — M9903 Segmental and somatic dysfunction of lumbar region: Secondary | ICD-10-CM | POA: Diagnosis not present

## 2019-02-23 DIAGNOSIS — M5431 Sciatica, right side: Secondary | ICD-10-CM | POA: Diagnosis not present

## 2019-03-02 DIAGNOSIS — M5431 Sciatica, right side: Secondary | ICD-10-CM | POA: Diagnosis not present

## 2019-03-02 DIAGNOSIS — M62830 Muscle spasm of back: Secondary | ICD-10-CM | POA: Diagnosis not present

## 2019-03-02 DIAGNOSIS — M9901 Segmental and somatic dysfunction of cervical region: Secondary | ICD-10-CM | POA: Diagnosis not present

## 2019-03-02 DIAGNOSIS — M545 Low back pain: Secondary | ICD-10-CM | POA: Diagnosis not present

## 2019-03-02 DIAGNOSIS — M9903 Segmental and somatic dysfunction of lumbar region: Secondary | ICD-10-CM | POA: Diagnosis not present

## 2019-03-02 DIAGNOSIS — M9904 Segmental and somatic dysfunction of sacral region: Secondary | ICD-10-CM | POA: Diagnosis not present

## 2019-03-09 DIAGNOSIS — M62830 Muscle spasm of back: Secondary | ICD-10-CM | POA: Diagnosis not present

## 2019-03-09 DIAGNOSIS — F419 Anxiety disorder, unspecified: Secondary | ICD-10-CM | POA: Diagnosis not present

## 2019-03-09 DIAGNOSIS — M9901 Segmental and somatic dysfunction of cervical region: Secondary | ICD-10-CM | POA: Diagnosis not present

## 2019-03-09 DIAGNOSIS — M545 Low back pain: Secondary | ICD-10-CM | POA: Diagnosis not present

## 2019-03-09 DIAGNOSIS — M9903 Segmental and somatic dysfunction of lumbar region: Secondary | ICD-10-CM | POA: Diagnosis not present

## 2019-03-09 DIAGNOSIS — M9904 Segmental and somatic dysfunction of sacral region: Secondary | ICD-10-CM | POA: Diagnosis not present

## 2019-03-09 DIAGNOSIS — M5431 Sciatica, right side: Secondary | ICD-10-CM | POA: Diagnosis not present

## 2019-03-16 DIAGNOSIS — M62830 Muscle spasm of back: Secondary | ICD-10-CM | POA: Diagnosis not present

## 2019-03-16 DIAGNOSIS — M9904 Segmental and somatic dysfunction of sacral region: Secondary | ICD-10-CM | POA: Diagnosis not present

## 2019-03-16 DIAGNOSIS — M9901 Segmental and somatic dysfunction of cervical region: Secondary | ICD-10-CM | POA: Diagnosis not present

## 2019-03-16 DIAGNOSIS — M545 Low back pain: Secondary | ICD-10-CM | POA: Diagnosis not present

## 2019-03-16 DIAGNOSIS — M9903 Segmental and somatic dysfunction of lumbar region: Secondary | ICD-10-CM | POA: Diagnosis not present

## 2019-03-16 DIAGNOSIS — F419 Anxiety disorder, unspecified: Secondary | ICD-10-CM | POA: Diagnosis not present

## 2019-03-16 DIAGNOSIS — M5431 Sciatica, right side: Secondary | ICD-10-CM | POA: Diagnosis not present

## 2019-03-23 DIAGNOSIS — M9903 Segmental and somatic dysfunction of lumbar region: Secondary | ICD-10-CM | POA: Diagnosis not present

## 2019-03-23 DIAGNOSIS — M9904 Segmental and somatic dysfunction of sacral region: Secondary | ICD-10-CM | POA: Diagnosis not present

## 2019-03-23 DIAGNOSIS — M545 Low back pain: Secondary | ICD-10-CM | POA: Diagnosis not present

## 2019-03-23 DIAGNOSIS — M62830 Muscle spasm of back: Secondary | ICD-10-CM | POA: Diagnosis not present

## 2019-03-23 DIAGNOSIS — M5431 Sciatica, right side: Secondary | ICD-10-CM | POA: Diagnosis not present

## 2019-03-23 DIAGNOSIS — F419 Anxiety disorder, unspecified: Secondary | ICD-10-CM | POA: Diagnosis not present

## 2019-03-23 DIAGNOSIS — M9901 Segmental and somatic dysfunction of cervical region: Secondary | ICD-10-CM | POA: Diagnosis not present

## 2019-03-30 DIAGNOSIS — M9901 Segmental and somatic dysfunction of cervical region: Secondary | ICD-10-CM | POA: Diagnosis not present

## 2019-03-30 DIAGNOSIS — M5431 Sciatica, right side: Secondary | ICD-10-CM | POA: Diagnosis not present

## 2019-03-30 DIAGNOSIS — M545 Low back pain: Secondary | ICD-10-CM | POA: Diagnosis not present

## 2019-03-30 DIAGNOSIS — F419 Anxiety disorder, unspecified: Secondary | ICD-10-CM | POA: Diagnosis not present

## 2019-03-30 DIAGNOSIS — M9903 Segmental and somatic dysfunction of lumbar region: Secondary | ICD-10-CM | POA: Diagnosis not present

## 2019-03-30 DIAGNOSIS — M62830 Muscle spasm of back: Secondary | ICD-10-CM | POA: Diagnosis not present

## 2019-03-30 DIAGNOSIS — M9904 Segmental and somatic dysfunction of sacral region: Secondary | ICD-10-CM | POA: Diagnosis not present

## 2019-04-06 DIAGNOSIS — M9904 Segmental and somatic dysfunction of sacral region: Secondary | ICD-10-CM | POA: Diagnosis not present

## 2019-04-06 DIAGNOSIS — M545 Low back pain: Secondary | ICD-10-CM | POA: Diagnosis not present

## 2019-04-06 DIAGNOSIS — M9903 Segmental and somatic dysfunction of lumbar region: Secondary | ICD-10-CM | POA: Diagnosis not present

## 2019-04-06 DIAGNOSIS — M9901 Segmental and somatic dysfunction of cervical region: Secondary | ICD-10-CM | POA: Diagnosis not present

## 2019-04-06 DIAGNOSIS — M5431 Sciatica, right side: Secondary | ICD-10-CM | POA: Diagnosis not present

## 2019-04-06 DIAGNOSIS — M62830 Muscle spasm of back: Secondary | ICD-10-CM | POA: Diagnosis not present

## 2019-04-13 DIAGNOSIS — M9901 Segmental and somatic dysfunction of cervical region: Secondary | ICD-10-CM | POA: Diagnosis not present

## 2019-04-13 DIAGNOSIS — M545 Low back pain: Secondary | ICD-10-CM | POA: Diagnosis not present

## 2019-04-13 DIAGNOSIS — M62830 Muscle spasm of back: Secondary | ICD-10-CM | POA: Diagnosis not present

## 2019-04-13 DIAGNOSIS — M5431 Sciatica, right side: Secondary | ICD-10-CM | POA: Diagnosis not present

## 2019-04-13 DIAGNOSIS — M9904 Segmental and somatic dysfunction of sacral region: Secondary | ICD-10-CM | POA: Diagnosis not present

## 2019-04-13 DIAGNOSIS — M9903 Segmental and somatic dysfunction of lumbar region: Secondary | ICD-10-CM | POA: Diagnosis not present

## 2019-04-20 DIAGNOSIS — M545 Low back pain: Secondary | ICD-10-CM | POA: Diagnosis not present

## 2019-04-20 DIAGNOSIS — M9901 Segmental and somatic dysfunction of cervical region: Secondary | ICD-10-CM | POA: Diagnosis not present

## 2019-04-20 DIAGNOSIS — M5431 Sciatica, right side: Secondary | ICD-10-CM | POA: Diagnosis not present

## 2019-04-20 DIAGNOSIS — M62830 Muscle spasm of back: Secondary | ICD-10-CM | POA: Diagnosis not present

## 2019-04-20 DIAGNOSIS — M9903 Segmental and somatic dysfunction of lumbar region: Secondary | ICD-10-CM | POA: Diagnosis not present

## 2019-04-20 DIAGNOSIS — M9904 Segmental and somatic dysfunction of sacral region: Secondary | ICD-10-CM | POA: Diagnosis not present

## 2019-04-26 DIAGNOSIS — M9904 Segmental and somatic dysfunction of sacral region: Secondary | ICD-10-CM | POA: Diagnosis not present

## 2019-04-26 DIAGNOSIS — M9901 Segmental and somatic dysfunction of cervical region: Secondary | ICD-10-CM | POA: Diagnosis not present

## 2019-04-26 DIAGNOSIS — M545 Low back pain: Secondary | ICD-10-CM | POA: Diagnosis not present

## 2019-04-26 DIAGNOSIS — M62830 Muscle spasm of back: Secondary | ICD-10-CM | POA: Diagnosis not present

## 2019-04-26 DIAGNOSIS — M9903 Segmental and somatic dysfunction of lumbar region: Secondary | ICD-10-CM | POA: Diagnosis not present

## 2019-04-26 DIAGNOSIS — M5431 Sciatica, right side: Secondary | ICD-10-CM | POA: Diagnosis not present

## 2019-05-04 DIAGNOSIS — M9903 Segmental and somatic dysfunction of lumbar region: Secondary | ICD-10-CM | POA: Diagnosis not present

## 2019-05-04 DIAGNOSIS — M9901 Segmental and somatic dysfunction of cervical region: Secondary | ICD-10-CM | POA: Diagnosis not present

## 2019-05-04 DIAGNOSIS — F419 Anxiety disorder, unspecified: Secondary | ICD-10-CM | POA: Diagnosis not present

## 2019-05-04 DIAGNOSIS — M9904 Segmental and somatic dysfunction of sacral region: Secondary | ICD-10-CM | POA: Diagnosis not present

## 2019-05-04 DIAGNOSIS — M62830 Muscle spasm of back: Secondary | ICD-10-CM | POA: Diagnosis not present

## 2019-05-04 DIAGNOSIS — M5431 Sciatica, right side: Secondary | ICD-10-CM | POA: Diagnosis not present

## 2019-05-04 DIAGNOSIS — M545 Low back pain: Secondary | ICD-10-CM | POA: Diagnosis not present

## 2019-05-11 DIAGNOSIS — M62830 Muscle spasm of back: Secondary | ICD-10-CM | POA: Diagnosis not present

## 2019-05-11 DIAGNOSIS — M5431 Sciatica, right side: Secondary | ICD-10-CM | POA: Diagnosis not present

## 2019-05-11 DIAGNOSIS — M9904 Segmental and somatic dysfunction of sacral region: Secondary | ICD-10-CM | POA: Diagnosis not present

## 2019-05-11 DIAGNOSIS — M545 Low back pain: Secondary | ICD-10-CM | POA: Diagnosis not present

## 2019-05-11 DIAGNOSIS — F419 Anxiety disorder, unspecified: Secondary | ICD-10-CM | POA: Diagnosis not present

## 2019-05-11 DIAGNOSIS — M9903 Segmental and somatic dysfunction of lumbar region: Secondary | ICD-10-CM | POA: Diagnosis not present

## 2019-05-11 DIAGNOSIS — M9901 Segmental and somatic dysfunction of cervical region: Secondary | ICD-10-CM | POA: Diagnosis not present

## 2019-05-16 DIAGNOSIS — N529 Male erectile dysfunction, unspecified: Secondary | ICD-10-CM | POA: Diagnosis not present

## 2019-05-16 DIAGNOSIS — I10 Essential (primary) hypertension: Secondary | ICD-10-CM | POA: Diagnosis not present

## 2019-05-16 DIAGNOSIS — Z125 Encounter for screening for malignant neoplasm of prostate: Secondary | ICD-10-CM | POA: Diagnosis not present

## 2019-05-16 DIAGNOSIS — Z0000 Encounter for general adult medical examination without abnormal findings: Secondary | ICD-10-CM | POA: Diagnosis not present

## 2019-05-16 DIAGNOSIS — Z23 Encounter for immunization: Secondary | ICD-10-CM | POA: Diagnosis not present

## 2019-05-16 DIAGNOSIS — E7800 Pure hypercholesterolemia, unspecified: Secondary | ICD-10-CM | POA: Diagnosis not present

## 2019-05-18 DIAGNOSIS — F419 Anxiety disorder, unspecified: Secondary | ICD-10-CM | POA: Diagnosis not present

## 2019-05-18 DIAGNOSIS — M9904 Segmental and somatic dysfunction of sacral region: Secondary | ICD-10-CM | POA: Diagnosis not present

## 2019-05-18 DIAGNOSIS — M9901 Segmental and somatic dysfunction of cervical region: Secondary | ICD-10-CM | POA: Diagnosis not present

## 2019-05-18 DIAGNOSIS — M545 Low back pain: Secondary | ICD-10-CM | POA: Diagnosis not present

## 2019-05-18 DIAGNOSIS — M62830 Muscle spasm of back: Secondary | ICD-10-CM | POA: Diagnosis not present

## 2019-05-18 DIAGNOSIS — M9903 Segmental and somatic dysfunction of lumbar region: Secondary | ICD-10-CM | POA: Diagnosis not present

## 2019-05-18 DIAGNOSIS — M5431 Sciatica, right side: Secondary | ICD-10-CM | POA: Diagnosis not present

## 2019-05-25 DIAGNOSIS — M62830 Muscle spasm of back: Secondary | ICD-10-CM | POA: Diagnosis not present

## 2019-05-25 DIAGNOSIS — F419 Anxiety disorder, unspecified: Secondary | ICD-10-CM | POA: Diagnosis not present

## 2019-05-25 DIAGNOSIS — M545 Low back pain: Secondary | ICD-10-CM | POA: Diagnosis not present

## 2019-05-25 DIAGNOSIS — M9903 Segmental and somatic dysfunction of lumbar region: Secondary | ICD-10-CM | POA: Diagnosis not present

## 2019-05-25 DIAGNOSIS — M9901 Segmental and somatic dysfunction of cervical region: Secondary | ICD-10-CM | POA: Diagnosis not present

## 2019-05-25 DIAGNOSIS — M5431 Sciatica, right side: Secondary | ICD-10-CM | POA: Diagnosis not present

## 2019-05-25 DIAGNOSIS — M9904 Segmental and somatic dysfunction of sacral region: Secondary | ICD-10-CM | POA: Diagnosis not present

## 2019-05-31 DIAGNOSIS — N529 Male erectile dysfunction, unspecified: Secondary | ICD-10-CM | POA: Diagnosis not present

## 2019-05-31 DIAGNOSIS — Z0000 Encounter for general adult medical examination without abnormal findings: Secondary | ICD-10-CM | POA: Diagnosis not present

## 2019-05-31 DIAGNOSIS — E7800 Pure hypercholesterolemia, unspecified: Secondary | ICD-10-CM | POA: Diagnosis not present

## 2019-05-31 DIAGNOSIS — Z125 Encounter for screening for malignant neoplasm of prostate: Secondary | ICD-10-CM | POA: Diagnosis not present

## 2019-05-31 DIAGNOSIS — I10 Essential (primary) hypertension: Secondary | ICD-10-CM | POA: Diagnosis not present

## 2019-05-31 DIAGNOSIS — Z23 Encounter for immunization: Secondary | ICD-10-CM | POA: Diagnosis not present

## 2019-06-01 DIAGNOSIS — F419 Anxiety disorder, unspecified: Secondary | ICD-10-CM | POA: Diagnosis not present

## 2019-06-01 DIAGNOSIS — M62830 Muscle spasm of back: Secondary | ICD-10-CM | POA: Diagnosis not present

## 2019-06-01 DIAGNOSIS — M9901 Segmental and somatic dysfunction of cervical region: Secondary | ICD-10-CM | POA: Diagnosis not present

## 2019-06-01 DIAGNOSIS — M9903 Segmental and somatic dysfunction of lumbar region: Secondary | ICD-10-CM | POA: Diagnosis not present

## 2019-06-01 DIAGNOSIS — M9904 Segmental and somatic dysfunction of sacral region: Secondary | ICD-10-CM | POA: Diagnosis not present

## 2019-06-01 DIAGNOSIS — M545 Low back pain: Secondary | ICD-10-CM | POA: Diagnosis not present

## 2019-06-01 DIAGNOSIS — M5431 Sciatica, right side: Secondary | ICD-10-CM | POA: Diagnosis not present

## 2019-06-08 DIAGNOSIS — M5431 Sciatica, right side: Secondary | ICD-10-CM | POA: Diagnosis not present

## 2019-06-08 DIAGNOSIS — M545 Low back pain: Secondary | ICD-10-CM | POA: Diagnosis not present

## 2019-06-08 DIAGNOSIS — M9904 Segmental and somatic dysfunction of sacral region: Secondary | ICD-10-CM | POA: Diagnosis not present

## 2019-06-08 DIAGNOSIS — M62830 Muscle spasm of back: Secondary | ICD-10-CM | POA: Diagnosis not present

## 2019-06-08 DIAGNOSIS — F419 Anxiety disorder, unspecified: Secondary | ICD-10-CM | POA: Diagnosis not present

## 2019-06-08 DIAGNOSIS — M9903 Segmental and somatic dysfunction of lumbar region: Secondary | ICD-10-CM | POA: Diagnosis not present

## 2019-06-08 DIAGNOSIS — M9901 Segmental and somatic dysfunction of cervical region: Secondary | ICD-10-CM | POA: Diagnosis not present

## 2019-06-13 DIAGNOSIS — H2513 Age-related nuclear cataract, bilateral: Secondary | ICD-10-CM | POA: Diagnosis not present

## 2019-06-15 DIAGNOSIS — M9901 Segmental and somatic dysfunction of cervical region: Secondary | ICD-10-CM | POA: Diagnosis not present

## 2019-06-15 DIAGNOSIS — M9903 Segmental and somatic dysfunction of lumbar region: Secondary | ICD-10-CM | POA: Diagnosis not present

## 2019-06-15 DIAGNOSIS — M9904 Segmental and somatic dysfunction of sacral region: Secondary | ICD-10-CM | POA: Diagnosis not present

## 2019-06-15 DIAGNOSIS — M545 Low back pain: Secondary | ICD-10-CM | POA: Diagnosis not present

## 2019-06-15 DIAGNOSIS — M5431 Sciatica, right side: Secondary | ICD-10-CM | POA: Diagnosis not present

## 2019-06-15 DIAGNOSIS — M62830 Muscle spasm of back: Secondary | ICD-10-CM | POA: Diagnosis not present

## 2019-06-22 DIAGNOSIS — M9904 Segmental and somatic dysfunction of sacral region: Secondary | ICD-10-CM | POA: Diagnosis not present

## 2019-06-22 DIAGNOSIS — M9901 Segmental and somatic dysfunction of cervical region: Secondary | ICD-10-CM | POA: Diagnosis not present

## 2019-06-22 DIAGNOSIS — M9903 Segmental and somatic dysfunction of lumbar region: Secondary | ICD-10-CM | POA: Diagnosis not present

## 2019-06-22 DIAGNOSIS — M545 Low back pain: Secondary | ICD-10-CM | POA: Diagnosis not present

## 2019-06-22 DIAGNOSIS — M62830 Muscle spasm of back: Secondary | ICD-10-CM | POA: Diagnosis not present

## 2019-06-22 DIAGNOSIS — M5431 Sciatica, right side: Secondary | ICD-10-CM | POA: Diagnosis not present

## 2019-08-17 DIAGNOSIS — F419 Anxiety disorder, unspecified: Secondary | ICD-10-CM | POA: Diagnosis not present

## 2019-08-24 DIAGNOSIS — M5431 Sciatica, right side: Secondary | ICD-10-CM | POA: Diagnosis not present

## 2019-08-24 DIAGNOSIS — M62830 Muscle spasm of back: Secondary | ICD-10-CM | POA: Diagnosis not present

## 2019-08-24 DIAGNOSIS — M9904 Segmental and somatic dysfunction of sacral region: Secondary | ICD-10-CM | POA: Diagnosis not present

## 2019-08-24 DIAGNOSIS — M9901 Segmental and somatic dysfunction of cervical region: Secondary | ICD-10-CM | POA: Diagnosis not present

## 2019-08-24 DIAGNOSIS — M545 Low back pain: Secondary | ICD-10-CM | POA: Diagnosis not present

## 2019-08-24 DIAGNOSIS — F419 Anxiety disorder, unspecified: Secondary | ICD-10-CM | POA: Diagnosis not present

## 2019-08-24 DIAGNOSIS — M9903 Segmental and somatic dysfunction of lumbar region: Secondary | ICD-10-CM | POA: Diagnosis not present

## 2019-08-31 DIAGNOSIS — M545 Low back pain: Secondary | ICD-10-CM | POA: Diagnosis not present

## 2019-08-31 DIAGNOSIS — M9903 Segmental and somatic dysfunction of lumbar region: Secondary | ICD-10-CM | POA: Diagnosis not present

## 2019-08-31 DIAGNOSIS — M62830 Muscle spasm of back: Secondary | ICD-10-CM | POA: Diagnosis not present

## 2019-08-31 DIAGNOSIS — M9901 Segmental and somatic dysfunction of cervical region: Secondary | ICD-10-CM | POA: Diagnosis not present

## 2019-08-31 DIAGNOSIS — F419 Anxiety disorder, unspecified: Secondary | ICD-10-CM | POA: Diagnosis not present

## 2019-08-31 DIAGNOSIS — M9904 Segmental and somatic dysfunction of sacral region: Secondary | ICD-10-CM | POA: Diagnosis not present

## 2019-08-31 DIAGNOSIS — M5431 Sciatica, right side: Secondary | ICD-10-CM | POA: Diagnosis not present

## 2019-09-07 DIAGNOSIS — M9904 Segmental and somatic dysfunction of sacral region: Secondary | ICD-10-CM | POA: Diagnosis not present

## 2019-09-07 DIAGNOSIS — M9901 Segmental and somatic dysfunction of cervical region: Secondary | ICD-10-CM | POA: Diagnosis not present

## 2019-09-07 DIAGNOSIS — M62830 Muscle spasm of back: Secondary | ICD-10-CM | POA: Diagnosis not present

## 2019-09-07 DIAGNOSIS — F419 Anxiety disorder, unspecified: Secondary | ICD-10-CM | POA: Diagnosis not present

## 2019-09-07 DIAGNOSIS — M9903 Segmental and somatic dysfunction of lumbar region: Secondary | ICD-10-CM | POA: Diagnosis not present

## 2019-09-07 DIAGNOSIS — M545 Low back pain: Secondary | ICD-10-CM | POA: Diagnosis not present

## 2019-09-07 DIAGNOSIS — M5431 Sciatica, right side: Secondary | ICD-10-CM | POA: Diagnosis not present

## 2019-09-14 DIAGNOSIS — M9903 Segmental and somatic dysfunction of lumbar region: Secondary | ICD-10-CM | POA: Diagnosis not present

## 2019-09-14 DIAGNOSIS — M9901 Segmental and somatic dysfunction of cervical region: Secondary | ICD-10-CM | POA: Diagnosis not present

## 2019-09-14 DIAGNOSIS — M5431 Sciatica, right side: Secondary | ICD-10-CM | POA: Diagnosis not present

## 2019-09-14 DIAGNOSIS — M9904 Segmental and somatic dysfunction of sacral region: Secondary | ICD-10-CM | POA: Diagnosis not present

## 2019-09-14 DIAGNOSIS — M545 Low back pain: Secondary | ICD-10-CM | POA: Diagnosis not present

## 2019-09-14 DIAGNOSIS — F419 Anxiety disorder, unspecified: Secondary | ICD-10-CM | POA: Diagnosis not present

## 2019-09-14 DIAGNOSIS — M62830 Muscle spasm of back: Secondary | ICD-10-CM | POA: Diagnosis not present

## 2019-09-22 DIAGNOSIS — F419 Anxiety disorder, unspecified: Secondary | ICD-10-CM | POA: Diagnosis not present

## 2019-09-28 DIAGNOSIS — F419 Anxiety disorder, unspecified: Secondary | ICD-10-CM | POA: Diagnosis not present

## 2019-10-05 DIAGNOSIS — F419 Anxiety disorder, unspecified: Secondary | ICD-10-CM | POA: Diagnosis not present

## 2019-10-12 DIAGNOSIS — F419 Anxiety disorder, unspecified: Secondary | ICD-10-CM | POA: Diagnosis not present

## 2019-10-19 DIAGNOSIS — F419 Anxiety disorder, unspecified: Secondary | ICD-10-CM | POA: Diagnosis not present

## 2019-10-26 DIAGNOSIS — F419 Anxiety disorder, unspecified: Secondary | ICD-10-CM | POA: Diagnosis not present

## 2019-11-02 DIAGNOSIS — F419 Anxiety disorder, unspecified: Secondary | ICD-10-CM | POA: Diagnosis not present

## 2019-11-08 DIAGNOSIS — F419 Anxiety disorder, unspecified: Secondary | ICD-10-CM | POA: Diagnosis not present

## 2019-11-16 DIAGNOSIS — F419 Anxiety disorder, unspecified: Secondary | ICD-10-CM | POA: Diagnosis not present

## 2019-11-23 DIAGNOSIS — F419 Anxiety disorder, unspecified: Secondary | ICD-10-CM | POA: Diagnosis not present

## 2019-11-30 DIAGNOSIS — F419 Anxiety disorder, unspecified: Secondary | ICD-10-CM | POA: Diagnosis not present

## 2019-12-07 DIAGNOSIS — F419 Anxiety disorder, unspecified: Secondary | ICD-10-CM | POA: Diagnosis not present

## 2020-05-28 DIAGNOSIS — N529 Male erectile dysfunction, unspecified: Secondary | ICD-10-CM | POA: Diagnosis not present

## 2020-05-28 DIAGNOSIS — I10 Essential (primary) hypertension: Secondary | ICD-10-CM | POA: Diagnosis not present

## 2020-05-28 DIAGNOSIS — Z0000 Encounter for general adult medical examination without abnormal findings: Secondary | ICD-10-CM | POA: Diagnosis not present

## 2020-05-28 DIAGNOSIS — Z125 Encounter for screening for malignant neoplasm of prostate: Secondary | ICD-10-CM | POA: Diagnosis not present

## 2020-05-28 DIAGNOSIS — Z23 Encounter for immunization: Secondary | ICD-10-CM | POA: Diagnosis not present

## 2020-05-28 DIAGNOSIS — E7800 Pure hypercholesterolemia, unspecified: Secondary | ICD-10-CM | POA: Diagnosis not present

## 2020-05-28 DIAGNOSIS — R1310 Dysphagia, unspecified: Secondary | ICD-10-CM | POA: Diagnosis not present

## 2020-06-28 DIAGNOSIS — F3289 Other specified depressive episodes: Secondary | ICD-10-CM | POA: Diagnosis not present

## 2020-06-28 DIAGNOSIS — F438 Other reactions to severe stress: Secondary | ICD-10-CM | POA: Diagnosis not present

## 2020-07-18 DIAGNOSIS — F438 Other reactions to severe stress: Secondary | ICD-10-CM | POA: Diagnosis not present

## 2020-07-18 DIAGNOSIS — F3289 Other specified depressive episodes: Secondary | ICD-10-CM | POA: Diagnosis not present

## 2020-07-19 DIAGNOSIS — Z23 Encounter for immunization: Secondary | ICD-10-CM | POA: Diagnosis not present

## 2020-07-23 DIAGNOSIS — H2513 Age-related nuclear cataract, bilateral: Secondary | ICD-10-CM | POA: Diagnosis not present

## 2020-08-14 DIAGNOSIS — C4442 Squamous cell carcinoma of skin of scalp and neck: Secondary | ICD-10-CM | POA: Diagnosis not present

## 2020-08-14 DIAGNOSIS — L570 Actinic keratosis: Secondary | ICD-10-CM | POA: Diagnosis not present

## 2020-08-14 DIAGNOSIS — D485 Neoplasm of uncertain behavior of skin: Secondary | ICD-10-CM | POA: Diagnosis not present

## 2020-08-14 DIAGNOSIS — D3610 Benign neoplasm of peripheral nerves and autonomic nervous system, unspecified: Secondary | ICD-10-CM | POA: Diagnosis not present

## 2020-08-14 DIAGNOSIS — L821 Other seborrheic keratosis: Secondary | ICD-10-CM | POA: Diagnosis not present

## 2020-08-14 DIAGNOSIS — D225 Melanocytic nevi of trunk: Secondary | ICD-10-CM | POA: Diagnosis not present

## 2020-08-14 DIAGNOSIS — L578 Other skin changes due to chronic exposure to nonionizing radiation: Secondary | ICD-10-CM | POA: Diagnosis not present

## 2020-08-16 DIAGNOSIS — F3289 Other specified depressive episodes: Secondary | ICD-10-CM | POA: Diagnosis not present

## 2020-08-16 DIAGNOSIS — F438 Other reactions to severe stress: Secondary | ICD-10-CM | POA: Diagnosis not present

## 2020-08-28 DIAGNOSIS — F438 Other reactions to severe stress: Secondary | ICD-10-CM | POA: Diagnosis not present

## 2020-08-28 DIAGNOSIS — F3289 Other specified depressive episodes: Secondary | ICD-10-CM | POA: Diagnosis not present

## 2020-09-04 DIAGNOSIS — F438 Other reactions to severe stress: Secondary | ICD-10-CM | POA: Diagnosis not present

## 2020-09-04 DIAGNOSIS — F3289 Other specified depressive episodes: Secondary | ICD-10-CM | POA: Diagnosis not present

## 2020-09-07 DIAGNOSIS — Z20822 Contact with and (suspected) exposure to covid-19: Secondary | ICD-10-CM | POA: Diagnosis not present

## 2020-09-10 DIAGNOSIS — F321 Major depressive disorder, single episode, moderate: Secondary | ICD-10-CM | POA: Diagnosis not present

## 2020-09-18 DIAGNOSIS — F438 Other reactions to severe stress: Secondary | ICD-10-CM | POA: Diagnosis not present

## 2020-09-18 DIAGNOSIS — F3289 Other specified depressive episodes: Secondary | ICD-10-CM | POA: Diagnosis not present

## 2020-10-02 DIAGNOSIS — F438 Other reactions to severe stress: Secondary | ICD-10-CM | POA: Diagnosis not present

## 2020-10-02 DIAGNOSIS — F3289 Other specified depressive episodes: Secondary | ICD-10-CM | POA: Diagnosis not present

## 2020-10-09 DIAGNOSIS — F438 Other reactions to severe stress: Secondary | ICD-10-CM | POA: Diagnosis not present

## 2020-10-09 DIAGNOSIS — F3289 Other specified depressive episodes: Secondary | ICD-10-CM | POA: Diagnosis not present

## 2020-10-16 DIAGNOSIS — L988 Other specified disorders of the skin and subcutaneous tissue: Secondary | ICD-10-CM | POA: Diagnosis not present

## 2020-10-16 DIAGNOSIS — F438 Other reactions to severe stress: Secondary | ICD-10-CM | POA: Diagnosis not present

## 2020-10-16 DIAGNOSIS — C4442 Squamous cell carcinoma of skin of scalp and neck: Secondary | ICD-10-CM | POA: Diagnosis not present

## 2020-10-16 DIAGNOSIS — F3289 Other specified depressive episodes: Secondary | ICD-10-CM | POA: Diagnosis not present

## 2020-10-25 DIAGNOSIS — H9313 Tinnitus, bilateral: Secondary | ICD-10-CM | POA: Diagnosis not present

## 2020-10-30 DIAGNOSIS — F3289 Other specified depressive episodes: Secondary | ICD-10-CM | POA: Diagnosis not present

## 2020-10-30 DIAGNOSIS — F438 Other reactions to severe stress: Secondary | ICD-10-CM | POA: Diagnosis not present

## 2020-11-19 DIAGNOSIS — F438 Other reactions to severe stress: Secondary | ICD-10-CM | POA: Diagnosis not present

## 2020-11-19 DIAGNOSIS — F3289 Other specified depressive episodes: Secondary | ICD-10-CM | POA: Diagnosis not present

## 2020-12-05 DIAGNOSIS — F3289 Other specified depressive episodes: Secondary | ICD-10-CM | POA: Diagnosis not present

## 2020-12-05 DIAGNOSIS — F438 Other reactions to severe stress: Secondary | ICD-10-CM | POA: Diagnosis not present

## 2020-12-06 DIAGNOSIS — A09 Infectious gastroenteritis and colitis, unspecified: Secondary | ICD-10-CM | POA: Diagnosis not present

## 2020-12-06 DIAGNOSIS — R197 Diarrhea, unspecified: Secondary | ICD-10-CM | POA: Diagnosis not present

## 2020-12-17 DIAGNOSIS — F3289 Other specified depressive episodes: Secondary | ICD-10-CM | POA: Diagnosis not present

## 2020-12-17 DIAGNOSIS — F438 Other reactions to severe stress: Secondary | ICD-10-CM | POA: Diagnosis not present

## 2020-12-26 DIAGNOSIS — M25572 Pain in left ankle and joints of left foot: Secondary | ICD-10-CM | POA: Diagnosis not present

## 2020-12-31 DIAGNOSIS — M25572 Pain in left ankle and joints of left foot: Secondary | ICD-10-CM | POA: Diagnosis not present

## 2021-01-09 DIAGNOSIS — F438 Other reactions to severe stress: Secondary | ICD-10-CM | POA: Diagnosis not present

## 2021-01-09 DIAGNOSIS — F3289 Other specified depressive episodes: Secondary | ICD-10-CM | POA: Diagnosis not present

## 2021-01-23 DIAGNOSIS — M545 Low back pain, unspecified: Secondary | ICD-10-CM | POA: Diagnosis not present

## 2021-01-28 DIAGNOSIS — F438 Other reactions to severe stress: Secondary | ICD-10-CM | POA: Diagnosis not present

## 2021-01-28 DIAGNOSIS — F3289 Other specified depressive episodes: Secondary | ICD-10-CM | POA: Diagnosis not present

## 2021-02-04 DIAGNOSIS — F438 Other reactions to severe stress: Secondary | ICD-10-CM | POA: Diagnosis not present

## 2021-02-04 DIAGNOSIS — F3289 Other specified depressive episodes: Secondary | ICD-10-CM | POA: Diagnosis not present

## 2021-02-13 DIAGNOSIS — M545 Low back pain, unspecified: Secondary | ICD-10-CM | POA: Diagnosis not present

## 2021-02-14 DIAGNOSIS — E7800 Pure hypercholesterolemia, unspecified: Secondary | ICD-10-CM | POA: Diagnosis not present

## 2021-02-14 DIAGNOSIS — E785 Hyperlipidemia, unspecified: Secondary | ICD-10-CM | POA: Diagnosis not present

## 2021-02-14 DIAGNOSIS — F321 Major depressive disorder, single episode, moderate: Secondary | ICD-10-CM | POA: Diagnosis not present

## 2021-02-14 DIAGNOSIS — I10 Essential (primary) hypertension: Secondary | ICD-10-CM | POA: Diagnosis not present

## 2021-02-22 DIAGNOSIS — M545 Low back pain, unspecified: Secondary | ICD-10-CM | POA: Diagnosis not present

## 2021-02-27 DIAGNOSIS — M545 Low back pain, unspecified: Secondary | ICD-10-CM | POA: Diagnosis not present

## 2021-03-05 DIAGNOSIS — F438 Other reactions to severe stress: Secondary | ICD-10-CM | POA: Diagnosis not present

## 2021-03-05 DIAGNOSIS — F3289 Other specified depressive episodes: Secondary | ICD-10-CM | POA: Diagnosis not present

## 2021-03-06 DIAGNOSIS — M545 Low back pain, unspecified: Secondary | ICD-10-CM | POA: Diagnosis not present

## 2021-03-13 DIAGNOSIS — M545 Low back pain, unspecified: Secondary | ICD-10-CM | POA: Diagnosis not present

## 2021-03-18 DIAGNOSIS — F3289 Other specified depressive episodes: Secondary | ICD-10-CM | POA: Diagnosis not present

## 2021-03-18 DIAGNOSIS — F438 Other reactions to severe stress: Secondary | ICD-10-CM | POA: Diagnosis not present

## 2021-03-20 DIAGNOSIS — M545 Low back pain, unspecified: Secondary | ICD-10-CM | POA: Diagnosis not present

## 2021-03-27 DIAGNOSIS — M545 Low back pain, unspecified: Secondary | ICD-10-CM | POA: Diagnosis not present

## 2021-04-02 DIAGNOSIS — F438 Other reactions to severe stress: Secondary | ICD-10-CM | POA: Diagnosis not present

## 2021-04-02 DIAGNOSIS — F3289 Other specified depressive episodes: Secondary | ICD-10-CM | POA: Diagnosis not present

## 2021-04-03 DIAGNOSIS — M545 Low back pain, unspecified: Secondary | ICD-10-CM | POA: Diagnosis not present

## 2021-04-17 DIAGNOSIS — E7800 Pure hypercholesterolemia, unspecified: Secondary | ICD-10-CM | POA: Diagnosis not present

## 2021-04-17 DIAGNOSIS — F321 Major depressive disorder, single episode, moderate: Secondary | ICD-10-CM | POA: Diagnosis not present

## 2021-04-17 DIAGNOSIS — I10 Essential (primary) hypertension: Secondary | ICD-10-CM | POA: Diagnosis not present

## 2021-04-17 DIAGNOSIS — E785 Hyperlipidemia, unspecified: Secondary | ICD-10-CM | POA: Diagnosis not present

## 2021-04-18 DIAGNOSIS — F438 Other reactions to severe stress: Secondary | ICD-10-CM | POA: Diagnosis not present

## 2021-04-18 DIAGNOSIS — F3289 Other specified depressive episodes: Secondary | ICD-10-CM | POA: Diagnosis not present

## 2021-04-25 DIAGNOSIS — M545 Low back pain, unspecified: Secondary | ICD-10-CM | POA: Diagnosis not present

## 2021-05-09 DIAGNOSIS — M545 Low back pain, unspecified: Secondary | ICD-10-CM | POA: Diagnosis not present

## 2021-05-13 DIAGNOSIS — F331 Major depressive disorder, recurrent, moderate: Secondary | ICD-10-CM | POA: Diagnosis not present

## 2021-05-13 DIAGNOSIS — M545 Low back pain, unspecified: Secondary | ICD-10-CM | POA: Diagnosis not present

## 2021-05-20 DIAGNOSIS — F331 Major depressive disorder, recurrent, moderate: Secondary | ICD-10-CM | POA: Diagnosis not present

## 2021-05-27 ENCOUNTER — Emergency Department (HOSPITAL_BASED_OUTPATIENT_CLINIC_OR_DEPARTMENT_OTHER): Payer: Medicare Other

## 2021-05-27 ENCOUNTER — Observation Stay (HOSPITAL_BASED_OUTPATIENT_CLINIC_OR_DEPARTMENT_OTHER)
Admission: EM | Admit: 2021-05-27 | Discharge: 2021-05-28 | Disposition: A | Discharge status: Home / Self Care | Payer: Medicare Other | Attending: Emergency Medicine | Admitting: Emergency Medicine

## 2021-05-27 ENCOUNTER — Emergency Department (HOSPITAL_COMMUNITY): Payer: Medicare Other

## 2021-05-27 ENCOUNTER — Other Ambulatory Visit: Payer: Self-pay

## 2021-05-27 DIAGNOSIS — J939 Pneumothorax, unspecified: Secondary | ICD-10-CM

## 2021-05-27 DIAGNOSIS — W1839XA Other fall on same level, initial encounter: Secondary | ICD-10-CM | POA: Diagnosis not present

## 2021-05-27 DIAGNOSIS — J942 Hemothorax: Secondary | ICD-10-CM | POA: Diagnosis not present

## 2021-05-27 DIAGNOSIS — T1490XA Injury, unspecified, initial encounter: Secondary | ICD-10-CM

## 2021-05-27 DIAGNOSIS — J9811 Atelectasis: Secondary | ICD-10-CM | POA: Diagnosis not present

## 2021-05-27 DIAGNOSIS — M47812 Spondylosis without myelopathy or radiculopathy, cervical region: Secondary | ICD-10-CM | POA: Diagnosis not present

## 2021-05-27 DIAGNOSIS — R0902 Hypoxemia: Secondary | ICD-10-CM | POA: Diagnosis not present

## 2021-05-27 DIAGNOSIS — S2231XA Fracture of one rib, right side, initial encounter for closed fracture: Secondary | ICD-10-CM | POA: Diagnosis not present

## 2021-05-27 DIAGNOSIS — S2241XA Multiple fractures of ribs, right side, initial encounter for closed fracture: Secondary | ICD-10-CM | POA: Insufficient documentation

## 2021-05-27 DIAGNOSIS — Z20822 Contact with and (suspected) exposure to covid-19: Secondary | ICD-10-CM | POA: Diagnosis not present

## 2021-05-27 DIAGNOSIS — S3991XA Unspecified injury of abdomen, initial encounter: Secondary | ICD-10-CM | POA: Diagnosis not present

## 2021-05-27 DIAGNOSIS — Z043 Encounter for examination and observation following other accident: Secondary | ICD-10-CM | POA: Diagnosis not present

## 2021-05-27 DIAGNOSIS — I959 Hypotension, unspecified: Secondary | ICD-10-CM | POA: Diagnosis not present

## 2021-05-27 DIAGNOSIS — W19XXXA Unspecified fall, initial encounter: Secondary | ICD-10-CM | POA: Diagnosis not present

## 2021-05-27 DIAGNOSIS — I700 Atherosclerosis of aorta: Secondary | ICD-10-CM | POA: Diagnosis not present

## 2021-05-27 DIAGNOSIS — I10 Essential (primary) hypertension: Secondary | ICD-10-CM | POA: Insufficient documentation

## 2021-05-27 DIAGNOSIS — W11XXXA Fall on and from ladder, initial encounter: Secondary | ICD-10-CM | POA: Diagnosis not present

## 2021-05-27 DIAGNOSIS — S2249XA Multiple fractures of ribs, unspecified side, initial encounter for closed fracture: Secondary | ICD-10-CM | POA: Diagnosis present

## 2021-05-27 DIAGNOSIS — S270XXA Traumatic pneumothorax, initial encounter: Secondary | ICD-10-CM | POA: Diagnosis not present

## 2021-05-27 DIAGNOSIS — J439 Emphysema, unspecified: Secondary | ICD-10-CM | POA: Diagnosis not present

## 2021-05-27 LAB — CBC
HCT: 41.8 % (ref 39.0–52.0)
Hemoglobin: 13.8 g/dL (ref 13.0–17.0)
MCH: 31.5 pg (ref 26.0–34.0)
MCHC: 33 g/dL (ref 30.0–36.0)
MCV: 95.4 fL (ref 80.0–100.0)
Platelets: 270 10*3/uL (ref 150–400)
RBC: 4.38 MIL/uL (ref 4.22–5.81)
RDW: 13 % (ref 11.5–15.5)
WBC: 12.3 10*3/uL — ABNORMAL HIGH (ref 4.0–10.5)
nRBC: 0 % (ref 0.0–0.2)

## 2021-05-27 LAB — COMPREHENSIVE METABOLIC PANEL
ALT: 19 U/L (ref 0–44)
AST: 23 U/L (ref 15–41)
Albumin: 4.5 g/dL (ref 3.5–5.0)
Alkaline Phosphatase: 144 U/L — ABNORMAL HIGH (ref 38–126)
Anion gap: 11 (ref 5–15)
BUN: 20 mg/dL (ref 8–23)
CO2: 26 mmol/L (ref 22–32)
Calcium: 9.6 mg/dL (ref 8.9–10.3)
Chloride: 102 mmol/L (ref 98–111)
Creatinine, Ser: 1.08 mg/dL (ref 0.61–1.24)
GFR, Estimated: >60 mL/min (ref >60)
Glucose, Bld: 124 mg/dL — ABNORMAL HIGH (ref 70–99)
Potassium: 4.3 mmol/L (ref 3.5–5.1)
Sodium: 139 mmol/L (ref 135–145)
Total Bilirubin: 0.6 mg/dL (ref 0.3–1.2)
Total Protein: 7.4 g/dL (ref 6.5–8.1)

## 2021-05-27 LAB — ABO/RH: ABO/RH(D): O POS

## 2021-05-27 LAB — PROTIME-INR
INR: 1 (ref 0.8–1.2)
Prothrombin Time: 12.8 seconds (ref 11.4–15.2)

## 2021-05-27 LAB — LACTIC ACID, PLASMA: Lactic Acid, Venous: 2.1 mmol/L (ref 0.5–1.9)

## 2021-05-27 LAB — RESP PANEL BY RT-PCR (FLU A&B, COVID) ARPGX2
Influenza A by PCR: NEGATIVE
Influenza B by PCR: NEGATIVE
SARS Coronavirus 2 by RT PCR: NEGATIVE

## 2021-05-27 LAB — PREPARE RBC (CROSSMATCH)

## 2021-05-27 MED ORDER — SODIUM CHLORIDE 0.9% IV SOLUTION
Freq: Once | INTRAVENOUS | Status: DC
  Filled 2021-05-27: qty 250

## 2021-05-27 MED ORDER — LACTATED RINGERS IV SOLN: INTRAVENOUS | Status: DC

## 2021-05-27 MED ORDER — OXYCODONE HCL 5 MG PO TABS: 5.0000 mg | ORAL_TABLET | ORAL | Status: DC | PRN

## 2021-05-27 MED ORDER — ONDANSETRON HCL 4 MG/2ML IJ SOLN: 4.0000 mg | Freq: Four times a day (QID) | INTRAMUSCULAR | Status: DC | PRN

## 2021-05-27 MED ORDER — HYDRALAZINE HCL 10 MG PO TABS: 10.0000 mg | ORAL_TABLET | Freq: Four times a day (QID) | ORAL | Status: DC | PRN

## 2021-05-27 MED ORDER — OXYCODONE HCL 5 MG PO TABS
10.0000 mg | ORAL_TABLET | ORAL | Status: DC | PRN
  Administered 2021-05-28: 10 mg via ORAL
  Filled 2021-05-27: qty 2

## 2021-05-27 MED ORDER — FENTANYL CITRATE PF 50 MCG/ML IJ SOSY
50.0000 ug | PREFILLED_SYRINGE | Freq: Once | INTRAMUSCULAR | Status: AC
Start: 2021-05-27 — End: 2021-05-27
  Administered 2021-05-27: 50 ug via INTRAVENOUS

## 2021-05-27 MED ORDER — FENTANYL CITRATE PF 50 MCG/ML IJ SOSY
PREFILLED_SYRINGE | INTRAMUSCULAR | Status: AC
  Filled 2021-05-27: qty 1

## 2021-05-27 MED ORDER — ONDANSETRON 4 MG PO TBDP: 4.0000 mg | ORAL_TABLET | Freq: Four times a day (QID) | ORAL | Status: DC | PRN

## 2021-05-27 MED ORDER — FENTANYL CITRATE PF 50 MCG/ML IJ SOSY
PREFILLED_SYRINGE | INTRAMUSCULAR | Status: AC | PRN
  Administered 2021-05-27: 50 ug via INTRAVENOUS

## 2021-05-27 MED ORDER — FENTANYL CITRATE (PF) 100 MCG/2ML IJ SOLN
INTRAMUSCULAR | Status: AC
  Filled 2021-05-27: qty 2

## 2021-05-27 MED ORDER — ENOXAPARIN SODIUM 30 MG/0.3ML IJ SOSY
30.0000 mg | PREFILLED_SYRINGE | Freq: Two times a day (BID) | INTRAMUSCULAR | Status: DC
  Administered 2021-05-28: 30 mg via SUBCUTANEOUS
  Filled 2021-05-27: qty 0.3

## 2021-05-27 MED ORDER — HYDROMORPHONE HCL 1 MG/ML IJ SOLN
1.0000 mg | INTRAMUSCULAR | Status: DC | PRN
Start: 2021-05-27 — End: 2021-05-29
  Administered 2021-05-27 – 2021-05-28 (×4): 1 mg via INTRAVENOUS
  Filled 2021-05-27 (×4): qty 1

## 2021-05-27 MED ORDER — ACETAMINOPHEN 325 MG PO TABS
650.0000 mg | ORAL_TABLET | Freq: Four times a day (QID) | ORAL | Status: DC
  Administered 2021-05-27 – 2021-05-28 (×2): 650 mg via ORAL
  Filled 2021-05-27 (×4): qty 2

## 2021-05-27 MED ORDER — IOHEXOL 350 MG/ML SOLN
100.0000 mL | Freq: Once | INTRAVENOUS | Status: AC | PRN
  Administered 2021-05-27: 100 mL via INTRAVENOUS

## 2021-05-27 MED ORDER — FENTANYL CITRATE PF 50 MCG/ML IJ SOSY
50.0000 ug | PREFILLED_SYRINGE | Freq: Once | INTRAMUSCULAR | Status: AC
Start: 2021-05-27 — End: 2021-05-27
  Administered 2021-05-27: 50 ug via INTRAVENOUS
  Filled 2021-05-27: qty 1

## 2021-05-27 NOTE — ED Notes (Signed)
..  Trauma Response Nurse Note-  Reason for Call / Reason for Trauma activation:  Level 1 transfer from [place]- received 1 UPRBCs enroute - arrived with [facility] and RN Babs Bertin from [employer]. Pt has [place] collar on , 2 IV, LR infusing in left FA, IV in right AC saline locked on arrival.  Pt in considerable amount of pain with movement.   Transported to CT with TRN   Rolene Arbour, RN Trauma Response Nurse [phone]

## 2021-05-27 NOTE — Progress Notes (Signed)
Orthopedic Tech Progress Note Patient Details:  Richard Hicks September 18, 1950 [id number]  Level 1 trauma  Patient ID: Richard Hicks, male   DOB: Dec 26, 1950, 70 y.o.   MRN: [id number]  Richard Hicks 05/27/2021, 4:44 PM

## 2021-05-27 NOTE — Progress Notes (Signed)
This chaplain responded to Level 1 fall, transfer from [place], with the medical team.   The chaplain understands the Pt. wife is traveling from [place]. The chaplain understands the RN traveling with the Pt. is meeting the wife in the waiting area and will accompany her to Trauma C.  F/U spiritual care is available as needed.

## 2021-05-27 NOTE — ED Provider Notes (Signed)
[facility] EMERGENCY DEPT Provider Note   CSN: [id number] Arrival date & time: 05/27/21  1417     History Chief Complaint  Patient presents with   Richard Hicks is a 70 y.o. male.  Patient is a 70 year old male who presents after a fall.  He was working on a ladder and fell about 8 to 10 feet backward onto the ground.  He says he landed on his right back/flank.  He does not think he hit his head.  There is no loss of consciousness.  He has constant severe pain to his right ribs.  He is not on anticoagulants.  He denies any other injuries.  He was noted to have an initial blood pressure of 74/46.  He was brought back to resuscitation room.      Past Medical History:  Diagnosis Date   Hyperlipidemia    Hypertension    Syncope and collapse     Patient Active Problem List   Diagnosis Date Noted   Vasovagal syncope 07/02/2018   Essential hypertension 07/02/2018   Strain of peroneal tendon of left foot 02/13/2015   Pain of right great toe 02/13/2015    No past surgical history on file.     No family history on file.  Social History   Tobacco Use   Smoking status: Never   Smokeless tobacco: Never    Home Medications Prior to Admission medications   Medication Sig Start Date End Date Taking? Authorizing Provider  amLODipine (NORVASC) 10 MG tablet  06/07/18   [provider]  irbesartan (AVAPRO) 75 MG tablet Take 1 tablet (75 mg total) by mouth daily. 07/02/18   Hilty, Nadean Corwin, MD    Allergies    Patient has no known allergies.  Review of Systems   Review of Systems  Constitutional:  Negative for activity change, appetite change and fever.  HENT:  Negative for dental problem, nosebleeds and trouble swallowing.   Eyes:  Negative for pain and visual disturbance.  Respiratory:  Negative for shortness of breath.   Cardiovascular:  Positive for chest pain.  Gastrointestinal:  Negative for abdominal pain, nausea and vomiting.   Genitourinary:  Negative for dysuria and hematuria.  Musculoskeletal:  Positive for back pain. Negative for arthralgias, joint swelling and neck pain.  Skin:  Negative for wound.  Neurological:  Negative for weakness, numbness and headaches.  Psychiatric/Behavioral:  Negative for confusion.    Physical Exam Updated Vital Signs BP 128/72 (BP Location: Left Arm)   Pulse 65   Temp (!) 97.5 F (36.4 C)   Resp (!) 22   Ht 6' (1.829 m)   Wt 80.7 kg   SpO2 97%   BMI 24.14 kg/m   Physical Exam Vitals reviewed.  Constitutional:      Appearance: He is well-developed.  HENT:     Head: Normocephalic and atraumatic.     Nose: Nose normal.  Eyes:     Conjunctiva/sclera: Conjunctivae normal.     Pupils: Pupils are equal, round, and reactive to light.  Neck:     Comments: No pain to the cervical, thoracic, or LS spine.  No step-offs or deformities noted Cardiovascular:     Rate and Rhythm: Normal rate and regular rhythm.     Heart sounds: No murmur heard.    Comments: No evidence of external trauma to the chest or abdomen Pulmonary:     Effort: Pulmonary effort is normal. Tachypnea present. No respiratory distress.  Breath sounds: Normal breath sounds. No wheezing.     Comments: Pain to the right posterior ribs with some mild swelling to this area.  No open wounds Chest:     Chest wall: No tenderness.  Abdominal:     General: Bowel sounds are normal. There is no distension.     Palpations: Abdomen is soft.     Tenderness: There is no abdominal tenderness.  Musculoskeletal:        General: Normal range of motion.     Comments: No pain on palpation or ROM of the extremities  Skin:    General: Skin is warm and dry.     Capillary Refill: Capillary refill takes less than 2 seconds.  Neurological:     Mental Status: He is alert and oriented to person, place, and time.    ED Results / Procedures / Treatments   Labs (all labs ordered are listed, but only abnormal results are  displayed) Labs Reviewed  CBC - Abnormal; Notable for the following components:      Result Value   WBC 12.3 (*)    All other components within normal limits  RESP PANEL BY RT-PCR (FLU A&B, COVID) ARPGX2  PROTIME-INR  COMPREHENSIVE METABOLIC PANEL  ETHANOL  URINALYSIS, ROUTINE W REFLEX MICROSCOPIC  LACTIC ACID, PLASMA  SAMPLE TO BLOOD BANK  PREPARE RBC (CROSSMATCH)    EKG None  Radiology No results found.  Procedures Procedures   Medications Ordered in ED Medications  0.9 %  sodium chloride infusion (Manually program via Guardrails IV Fluids) (has no administration in time range)  0.9 %  sodium chloride infusion (Manually program via Guardrails IV Fluids) (has no administration in time range)  0.9 %  sodium chloride infusion (Manually program via Guardrails IV Fluids) (has no administration in time range)  fentaNYL (SUBLIMAZE) 50 MCG/ML injection (has no administration in time range)  fentaNYL (SUBLIMAZE) injection 50 mcg (50 mcg Intravenous Given 05/27/21 1518)  fentaNYL (SUBLIMAZE) injection 50 mcg (50 mcg Intravenous Given 05/27/21 1539)    ED Course  I have reviewed the triage vital signs and the nursing notes.  Pertinent labs & imaging results that were available during my care of the patient were reviewed by me and considered in my medical decision making (see chart for details).    MDM Rules/Calculators/A&P                           Patient is a 70 year old male who presents after a fall from 7 to 10 feet.  He was noted to be hypotensive on arrival.  Recheck of his blood pressure once he was back in the room was 90/70.  He had a portable chest x-ray which shows what appears to be multiple rib fractures on the right side, no significant pneumothorax was noted.  This was interpreted by me.  The left side was suboptimal but he does not have any reported pain to the side and he does not have any obvious pneumothorax.  X-ray of the pelvis showed no obvious fractures.   This was interpreted by me.  Bedside FAST was done by Dr. Roslynn Amble, was negative. Given his hypotension, he was started on a unit of packed red cells.  I spoke with Dr. Grandville Silos with the trauma service.  We will do a rapid transferred to [facility].  Dr. Grandville Silos will activate the patient as a level 1.  [facility] advised that he had no trucks available.  [facility] was on scene and will transport the patient.  CRITICAL CARE Performed by: Malvin Johns Total critical care time: 30 minutes Critical care time was exclusive of separately billable procedures and treating other patients. Critical care was necessary to treat or prevent imminent or life-threatening deterioration. Critical care was time spent personally by me on the following activities: development of treatment plan with patient and/or surrogate as well as nursing, discussions with consultants, evaluation of patient's response to treatment, examination of patient, obtaining history from patient or surrogate, ordering and performing treatments and interventions, ordering and review of laboratory studies, ordering and review of radiographic studies, pulse oximetry and re-evaluation of patient's condition.  Final Clinical Impression(s) / ED Diagnoses Final diagnoses:  Trauma    Rx / DC Orders ED Discharge Orders     None        Malvin Johns, MD 05/27/21 1552

## 2021-05-27 NOTE — ED Notes (Signed)
To CT

## 2021-05-27 NOTE — ED Provider Notes (Signed)
Ultrasound ED FAST  Date/Time: 05/27/2021 3:47 PM Performed by: Lucrezia Starch, MD Authorized by: Lucrezia Starch, MD  Procedure details:    Indications: blunt abdominal trauma and blunt chest trauma       Assess for:  Hemothorax, intra-abdominal fluid, pericardial effusion and pneumothorax    Technique:  Abdominal, cardiac and chest    Images: archived      Abdominal findings:    L kidney:  Visualized   R kidney:  Visualized   Liver:  Visualized    Bladder:  Visualized, Foley catheter not visualized   Hepatorenal space visualized: identified     Splenorenal space: identified     Rectovesical free fluid: not identified     Splenorenal free fluid: not identified     Hepatorenal space free fluid: not identified   Cardiac findings:    Heart:  Visualized   Wall motion: identified     Pericardial effusion: not identified   Chest findings:    L lung sliding: identified     R lung sliding: identified     Fluid in thorax: not identified   Comments:     Negative EFAST    Lucrezia Starch, MD 05/27/21 1548

## 2021-05-27 NOTE — Progress Notes (Signed)
..  Trauma Response Nurse Note-  Reason for Call :   -Transport patient to 6N  Initial Focused Assessment (If applicable, or please see trauma documentation):  Pt continues to be guarded with breathing and moving d/t rib pain  Interventions Transportation and education  Plan of Care as of this note:    Event Summary:   Pt transported uneventfully to 6N, care transferred to [facility] staff.   [place] Response Nurse [phone]

## 2021-05-27 NOTE — ED Notes (Signed)
Report called to [facility] Charge

## 2021-05-27 NOTE — ED Notes (Addendum)
Pt from [facility] enroute to [facility] emergency traffic

## 2021-05-27 NOTE — ED Provider Notes (Signed)
[facility] for further trauma evaluation.  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  [facility] where he was found to be hypotensive with significant right back and right chest pain.  Due to the hypotension which was reportedly 60 systolic at 1 point, patient was given a unit of blood and transferred for trauma evaluation.  He reportedly was found to have several rib fractures and Dr. Tamera Punt called to relay that radiology saw evidence of some subcutaneous gas on the right chest which may indicate an occult pneumothorax.  On arrival, blood pressure was in the 015I systolic and he is completed a unit of blood.  Patient reports that he had no preceding symptoms such as chest pain, lightheadedness, or near syncope while on the ladder and this was a mechanical fall leading to the injuries.  Breath sounds were symmetric on my exam and he did have right lateral chest wall tenderness primarily.  Abdomen was nontender.  Trauma feels patient is appropriate for pan scan CT imaging to further assess injuries.  He was placed on nasal cannula oxygen for some soft oxygen saturations around 90% with the shortness of breath.  Anticipate trauma admission after work-up is completed.   Trauma will admit for multiple rib fractures.  Clinical Impression: 1. Trauma   2. Pneumothorax on right     Disposition: Admit  This note was prepared with assistance of Dragon voice recognition software. Occasional wrong-word or sound-a-like substitutions may have occurred due to the inherent limitations of voice recognition software.     Rajanae Mantia, Gwenyth Allegra, MD 05/27/21 2228

## 2021-05-27 NOTE — ED Triage Notes (Addendum)
Pt fell of ladder from approximately 8 to 10 feet off the ground while at work. Pt stated that he fell strait back onto his back. Pt denis hitting head. Pt complains of pain to his back on the right side. Pt. Does not think he is on thinners Pt at 97% but has labored breathing respirations in the mid 35- 40.  Additional note During triage Pt began repirations increased to 35-40, retratctions oygenation 235% but Systolic Bp's 90, 85, 57,DUKG manuel taken at 80/54.MD contacted and pt taken to trauma

## 2021-05-27 NOTE — H&P (Signed)
[facility] Surgery Trauma Admission Note  Richard Hicks Mar 31, 1951  [id number].    Requesting MD: Tamera Punt, MD Chief Complaint/Reason for Consult: fall, hypotension  HPI:  [name] is a 70 y/o M who presented as a level 1 trauma from [facility] [place]. He was at work, on a ladder, when he reached for something and fell today around 1330. He denies LOC. He drove himself home but, due to persistent R sided posterior chest wall pain, he presented [facility]. In the ED he was noted to have multiple right posterior rib fractures and hypotension. He was given a unit of blood and transferred to [facility] for further evaluation. Cc right sided chest/back pain associated with SOB.   Reports PMH HTN. Denies use of blood thinners. Currently works as a Chief Strategy Officer. States he drinks 3 beers daily and occasionally smokes cigars. Denies other drug use. States his wife is on her way here.  ROS: Review of Systems  Constitutional: Negative.   HENT: Negative.    Eyes: Negative.   Respiratory:  Positive for shortness of breath.   Cardiovascular:  Positive for chest pain.  Gastrointestinal: Negative.   Genitourinary: Negative.   Musculoskeletal:  Positive for back pain and falls.  Skin: Negative.   Neurological: Negative.   Endo/Heme/Allergies: Negative.   Psychiatric/Behavioral: Negative.     No family history on file.  Past Medical History:  Diagnosis Date   Hyperlipidemia    Hypertension    Syncope and collapse     No past surgical history on file.  Social History:  reports that he has never smoked. He has never used smokeless tobacco. No history on file for alcohol use and drug use.  Allergies: No Known Allergies  (Not in a hospital admission)   Blood pressure 128/72, pulse 65, temperature (!) 97.5 F (36.4 C), resp. rate (!) 22, height 6' (1.829 m), weight 80.7 kg, SpO2 97 %. Physical Exam: Constitutional: WDWN white male who appears  stated age, appears uncomfortable but no acute distress Eyes: Moist conjunctiva; no lid lag; anicteric; PERRL Neck:  c-collar in place, Trachea midline; no stridor Lungs: slightly labored on room air, CTAB diminished breath sounds bilateral bases CV: RRR; no palpable thrills; no pitting edema GI: Abd soft, non-tender; no palpable hepatosplenomegaly MSK: symmetrical, no clubbing/cyanosis, chronic deformity right second toe. Psychiatric: Appropriate affect; alert and oriented x3 Lymphatic: No palpable cervical or axillary lymphadenopathy Neuro: no focal deficit, CN II-XII grossly in tact.    Results for orders placed or performed during the hospital encounter of 05/27/21 (from the past 48 hour(s))  Comprehensive metabolic panel     Status: Abnormal   Collection Time: 05/27/21  3:15 PM  Result Value Ref Range   Sodium 139 135 - 145 mmol/L   Potassium 4.3 3.5 - 5.1 mmol/L   Chloride 102 98 - 111 mmol/L   CO2 26 22 - 32 mmol/L   Glucose, Bld 124 (H) 70 - 99 mg/dL    Comment: Glucose reference range applies only to samples taken after fasting for at least 8 hours.   BUN 20 8 - 23 mg/dL   Creatinine, Ser 1.08 0.61 - 1.24 mg/dL   Calcium 9.6 8.9 - 10.3 mg/dL   Total Protein 7.4 6.5 - 8.1 g/dL   Albumin 4.5 3.5 - 5.0 g/dL   AST 23 15 - 41 U/L   ALT 19 0 - 44 U/L   Alkaline Phosphatase 144 (H) 38 - 126 U/L  Total Bilirubin 0.6 0.3 - 1.2 mg/dL   GFR, Estimated >60 >60 mL/min    Comment: (NOTE) Calculated using the CKD-EPI Creatinine Equation (2021)    Anion gap 11 5 - 15    Comment: Performed at [facility], [address], [place], [place] [address]  CBC     Status: Abnormal   Collection Time: 05/27/21  3:15 PM  Result Value Ref Range   WBC 12.3 (H) 4.0 - 10.5 K/uL   RBC 4.38 4.22 - 5.81 MIL/uL   Hemoglobin 13.8 13.0 - 17.0 g/dL   HCT 41.8 39.0 - 52.0 %   MCV 95.4 80.0 - 100.0 fL   MCH 31.5 26.0 - 34.0 pg   MCHC 33.0 30.0 - 36.0 g/dL   RDW 13.0 11.5 - 15.5 %    Platelets 270 150 - 400 K/uL   nRBC 0.0 0.0 - 0.2 %    Comment: Performed at [facility], [address] [place], [place] [address]  Lactic acid, plasma     Status: Abnormal   Collection Time: 05/27/21  3:15 PM  Result Value Ref Range   Lactic Acid, Venous 2.1 (HH) 0.5 - 1.9 mmol/L    Comment: CRITICAL RESULT CALLED TO, READ BACK BY AND VERIFIED WITH: madison fountain rn 1605 05/27/2021 src Performed at [facility], [address] [place], [place] [address]   Protime-INR     Status: None   Collection Time: 05/27/21  3:15 PM  Result Value Ref Range   Prothrombin Time 12.8 11.4 - 15.2 seconds   INR 1.0 0.8 - 1.2    Comment: (NOTE) INR goal varies based on device and disease states. Performed at [facility], [address] [place], [place] [address]   Type and screen Ordered by PROVIDER DEFAULT     Status: None (Preliminary result)   Collection Time: 05/27/21  3:30 PM  Result Value Ref Range   ABO/RH(D) PENDING    Antibody Screen PENDING    Sample Expiration 05/30/2021,2359    Unit Number [id number]    Blood Component Type RED CELLS,LR    Unit division 00    Status of Unit ISSUED    Unit tag comment VERBAL ORDERS PER DR [id number]    Transfusion Status      OK TO TRANSFUSE Performed at [facility], [address], [place], [place] [address]    Crossmatch Result PENDING    DG Pelvis Portable  Result Date: 05/27/2021 CLINICAL DATA:  Fall from ladder. EXAM: PORTABLE PELVIS 1-2 VIEWS COMPARISON:  None. FINDINGS: There is no evidence of pelvic fracture or diastasis. Bilateral hips are intact without fracture or dislocation. Degenerative disc disease within the visualized lower lumbar spine. IMPRESSION: Negative. Electronically Signed   By: Davina Poke D.O.   On: 05/27/2021 15:50   DG Chest Port 1 View  Result Date: 05/27/2021 CLINICAL DATA:  Fall from ladder approximately 8-10 feet by  report. EXAM: PORTABLE CHEST 1 VIEW COMPARISON:  None FINDINGS: Multiple overlapping rib fractures of RIGHT-sided ribs are noted with subcutaneous emphysema. No definable pleural line is visualized in the RIGHT chest but suspect pneumothorax given the presence of subcutaneous emphysema along the RIGHT chest wall. No lobar consolidation. Chest is incompletely imaged with respect to LEFT lateral chest. RIGHT-sided rib fractures involve fourth fifth, sixth and seventh as well as eighth ribs with overlapping and considerable displacement. Segmental fractures of the seventh and eighth ribs. Chest incompletely visualized in terms of bony thorax. Cardiomediastinal contours  with respect to visualized portions are unremarkable. LEFT chest excluded from view as described. IMPRESSION: Multiple overlapping rib fractures of the RIGHT-sided ribs with subcutaneous emphysema. Suspect otherwise occult pneumothorax on the RIGHT. Chest CT or full trauma assessment as warranted is suggested given that the chest is incompletely imaged and there are findings of extensive trauma to the RIGHT hemithorax. These results were called by telephone at the time of interpretation on 05/27/2021 at 4:01 pm to provider MELANIE BELFI , who verbally acknowledged these results. At this time the patient is currently in route to a trauma center for complete assessment per the provider. Electronically Signed   By: Zetta Bills M.D.   On: 05/27/2021 16:01      Assessment/Plan 70 y.o M s/p fall 7 feet from ladder, no LOC Right posterior rib fractures 4-9 with occult PTX and trace HTX - multimodal pain control, IS/Pulm toilet, CXR in AM EtOH use - CIWA HTN - hold with low BP FEN: admit to med surg ID: none VTE: SCD's, Lovenox Foley: none Dispo: admit to trauma service inpatient, CXR in AM, PT/OT I also spoke with his wife at the bedside  Georganna Skeans, MD, MPH, FACS Please use [email or web address] to contact on call provider  Jill Alexanders,  [facility] Surgery Please see Amion for pager number during day hours 7:00am-4:30pm 05/27/2021, 4:11 PM

## 2021-05-28 ENCOUNTER — Inpatient Hospital Stay (HOSPITAL_COMMUNITY): Payer: Medicare Other

## 2021-05-28 DIAGNOSIS — S2241XA Multiple fractures of ribs, right side, initial encounter for closed fracture: Secondary | ICD-10-CM | POA: Diagnosis not present

## 2021-05-28 DIAGNOSIS — S270XXA Traumatic pneumothorax, initial encounter: Secondary | ICD-10-CM | POA: Diagnosis not present

## 2021-05-28 DIAGNOSIS — S2231XA Fracture of one rib, right side, initial encounter for closed fracture: Secondary | ICD-10-CM | POA: Diagnosis not present

## 2021-05-28 DIAGNOSIS — I517 Cardiomegaly: Secondary | ICD-10-CM | POA: Diagnosis not present

## 2021-05-28 LAB — CBC
HCT: 38.5 % — ABNORMAL LOW (ref 39.0–52.0)
Hemoglobin: 12.6 g/dL — ABNORMAL LOW (ref 13.0–17.0)
MCH: 31.6 pg (ref 26.0–34.0)
MCHC: 32.7 g/dL (ref 30.0–36.0)
MCV: 96.5 fL (ref 80.0–100.0)
Platelets: 214 10*3/uL (ref 150–400)
RBC: 3.99 MIL/uL — ABNORMAL LOW (ref 4.22–5.81)
RDW: 14.1 % (ref 11.5–15.5)
WBC: 10.4 10*3/uL (ref 4.0–10.5)
nRBC: 0 % (ref 0.0–0.2)

## 2021-05-28 LAB — BPAM RBC
Blood Product Expiration Date: 202210302359
ISSUE DATE / TIME: 202210171532
Unit Type and Rh: 9500

## 2021-05-28 LAB — TYPE AND SCREEN
ABO/RH(D): O POS
Antibody Screen: NEGATIVE
Unit division: 0

## 2021-05-28 LAB — BASIC METABOLIC PANEL
Anion gap: 7 (ref 5–15)
BUN: 13 mg/dL (ref 8–23)
CO2: 24 mmol/L (ref 22–32)
Calcium: 8.2 mg/dL — ABNORMAL LOW (ref 8.9–10.3)
Chloride: 104 mmol/L (ref 98–111)
Creatinine, Ser: 0.77 mg/dL (ref 0.61–1.24)
GFR, Estimated: >60 mL/min (ref >60)
Glucose, Bld: 119 mg/dL — ABNORMAL HIGH (ref 70–99)
Potassium: 3.8 mmol/L (ref 3.5–5.1)
Sodium: 135 mmol/L (ref 135–145)

## 2021-05-28 MED ORDER — SODIUM CHLORIDE 0.9 % IV SOLN: 250.0000 mL | INTRAVENOUS | Status: DC | PRN

## 2021-05-28 MED ORDER — DEXTROSE 5 % IV SOLN
1000.0000 mg | Freq: Three times a day (TID) | INTRAVENOUS | Status: DC
Start: 2021-05-28 — End: 2021-05-28
  Administered 2021-05-28: 1000 mg via INTRAVENOUS
  Filled 2021-05-28: qty 10

## 2021-05-28 MED ORDER — POLYETHYLENE GLYCOL 3350 17 G PO PACK: 17.0000 g | PACK | Freq: Every day | ORAL | Status: DC | PRN

## 2021-05-28 MED ORDER — IBUPROFEN 600 MG PO TABS
600.0000 mg | ORAL_TABLET | Freq: Three times a day (TID) | ORAL | Status: DC
  Administered 2021-05-28 (×2): 600 mg via ORAL
  Filled 2021-05-28 (×2): qty 1

## 2021-05-28 MED ORDER — SODIUM CHLORIDE 0.9% FLUSH: 3.0000 mL | Freq: Two times a day (BID) | INTRAVENOUS | Status: DC

## 2021-05-28 MED ORDER — SODIUM CHLORIDE 0.9% FLUSH: 3.0000 mL | INTRAVENOUS | Status: DC | PRN

## 2021-05-28 MED ORDER — METHOCARBAMOL 500 MG PO TABS: 1000.0000 mg | ORAL_TABLET | Freq: Four times a day (QID) | ORAL | 0 refills | Status: AC | PRN

## 2021-05-28 MED ORDER — OXYCODONE HCL 5 MG PO TABS: 5.0000 mg | ORAL_TABLET | Freq: Four times a day (QID) | ORAL | 0 refills | Status: AC | PRN

## 2021-05-28 MED ORDER — LIDOCAINE 5 % EX PTCH: 1.0000 | MEDICATED_PATCH | CUTANEOUS | 0 refills | Status: AC

## 2021-05-28 MED ORDER — ACETAMINOPHEN 325 MG PO TABS: 650.0000 mg | ORAL_TABLET | Freq: Four times a day (QID) | ORAL | Status: AC | PRN

## 2021-05-28 MED ORDER — DOCUSATE SODIUM 100 MG PO CAPS: 100.0000 mg | ORAL_CAPSULE | Freq: Every day | ORAL | Status: AC | PRN

## 2021-05-28 MED ORDER — IBUPROFEN 600 MG PO TABS: 600.0000 mg | ORAL_TABLET | Freq: Three times a day (TID) | ORAL | 0 refills | Status: AC | PRN

## 2021-05-28 MED ORDER — LIDOCAINE 5 % EX PTCH
1.0000 | MEDICATED_PATCH | CUTANEOUS | Status: DC
  Administered 2021-05-28: 1 via TRANSDERMAL
  Filled 2021-05-28: qty 1

## 2021-05-28 MED ORDER — LOPERAMIDE HCL 2 MG PO CAPS: 2.0000 mg | ORAL_CAPSULE | Freq: Every day | ORAL | Status: DC | PRN

## 2021-05-28 MED ORDER — PANTOPRAZOLE SODIUM 20 MG PO TBEC
20.0000 mg | DELAYED_RELEASE_TABLET | Freq: Every day | ORAL | Status: DC
  Administered 2021-05-28: 20 mg via ORAL
  Filled 2021-05-28: qty 1

## 2021-05-28 MED ORDER — DOCUSATE SODIUM 100 MG PO CAPS: 100.0000 mg | ORAL_CAPSULE | Freq: Every day | ORAL | Status: DC | PRN

## 2021-05-28 MED ORDER — METHOCARBAMOL 500 MG PO TABS
500.0000 mg | ORAL_TABLET | Freq: Three times a day (TID) | ORAL | Status: DC
  Administered 2021-05-28: 500 mg via ORAL
  Filled 2021-05-28: qty 1

## 2021-05-28 MED ORDER — METHOCARBAMOL 500 MG PO TABS
1000.0000 mg | ORAL_TABLET | Freq: Four times a day (QID) | ORAL | Status: DC
  Administered 2021-05-28: 1000 mg via ORAL
  Filled 2021-05-28: qty 2

## 2021-05-28 NOTE — Evaluation (Signed)
Occupational Therapy Evaluation Patient Details Name: Richard Hicks MRN: [id number] DOB: [birth year] Today's Date: 05/28/2021   History of Present Illness Pt is a 70 yr old male who presented due to a fall from a ladder. Pt found to have multiple R posterior Rib fxs 4-9, hypotension and possible occult pneumothorax. PMH but not limited to: hyperlipidemia, HTN, syncope   Clinical Impression   Pt in session was very limited due to pain even with pre medications. Pt was able to complete rolling to side with min to min assist and then reported they could not complete anything further. Pt then required moderate assist to reposition with increase time as guarding and on room air want into 80s and required cues on breathing and pacing. Pt currently with functional limitations due to the deficits listed below (see OT Problem List).  Pt will benefit from skilled OT to increase their safety and independence with ADL and functional mobility for ADL to facilitate discharge to venue listed below.        Recommendations for follow up therapy are one component of a multi-disciplinary discharge planning process, led by the attending physician.  Recommendations may be updated based on patient status, additional functional criteria and insurance authorization.   Follow Up Recommendations  Other (comment) (TBD as very limited due to pain but at this time SNF level. Spoke to PA about pain levels in session.)    Equipment Recommendations   (TBD)    Recommendations for Other Services       Precautions / Restrictions Precautions Precautions: Fall Precaution Comments: attempted to educate about positions to decrease in pain with movement Restrictions Weight Bearing Restrictions: No      Mobility Bed Mobility Overal bed mobility: Needs Assistance Bed Mobility: Rolling Rolling: Min assist (HOB elevated)         General bed mobility comments: max increase in time    Transfers Overall transfer  level:  (unable to complete due to pain)                    Balance                                           ADL either performed or assessed with clinical judgement   ADL Overall ADL's : Needs assistance/impaired Eating/Feeding: Set up;Sitting   Grooming: Wash/dry hands;Wash/dry face;Set up;Cueing for safety;Cueing for sequencing;Bed level   Upper Body Bathing: Moderate assistance;Cueing for safety;Cueing for sequencing;Bed level   Lower Body Bathing: Moderate assistance;Cueing for safety;Cueing for sequencing;Bed level   Upper Body Dressing : Moderate assistance;Cueing for safety;Cueing for sequencing;Bed level   Lower Body Dressing: Moderate assistance;Bed level;Cueing for safety;Cueing for sequencing                 General ADL Comments: very limited due to pain and spoke to PA     Vision Baseline Vision/History: 1 Wears glasses Ability to See in Adequate Light: 0 Adequate Patient Visual Report: No change from baseline       Perception     Praxis      Pertinent Vitals/Pain Pain Assessment: 0-10 Pain Score: 8  Pain Location: R ribs but increase in pain with any movement Pain Descriptors / Indicators: Aching;Contraction;Cramping;Crushing;Discomfort;Grimacing;Guarding Pain Intervention(s): Limited activity within patient's tolerance;Monitored during session;Premedicated before session     Hand Dominance Right   Extremity/Trunk Assessment Upper Extremity Assessment Upper  Extremity Assessment: RUE deficits/detail;LUE deficits/detail RUE Deficits / Details: limited due to guarding with pain LUE Deficits / Details: limited due to guarding with pain   Lower Extremity Assessment Lower Extremity Assessment: Defer to PT evaluation   Cervical / Trunk Assessment Cervical / Trunk Assessment: Normal   Communication Communication Communication: No difficulties   Cognition Arousal/Alertness: Awake/alert Behavior During Therapy: WFL for  tasks assessed/performed Overall Cognitive Status: Within Functional Limits for tasks assessed                                     General Comments       Exercises     Shoulder Instructions      Home Living Family/patient expects to be discharged to:: Private residence Living Arrangements: Spouse/significant other Available Help at Discharge: Family (wife PRN) Type of Home: House       Home Layout: One level     Bathroom Shower/Tub: Occupational psychologist: Standard Bathroom Accessibility: Yes   Home Equipment: Cane - single point          Prior Functioning/Environment Level of Independence: Independent                 OT Problem List: Decreased strength;Decreased range of motion;Decreased activity tolerance;Impaired balance (sitting and/or standing);Decreased safety awareness;Decreased knowledge of use of DME or AE;Pain      OT Treatment/Interventions: Self-care/ADL training;Therapeutic exercise;DME and/or AE instruction;Therapeutic activities;Patient/family education;Balance training    OT Goals(Current goals can be found in the care plan section) Acute Rehab OT Goals Patient Stated Goal: to be more comfortable OT Goal Formulation: With patient Time For Goal Achievement: 06/15/21 Potential to Achieve Goals: Good  OT Frequency: Min 2X/week   Barriers to D/C:            Co-evaluation              AM-PAC OT "6 Clicks" Daily Activity     Outcome Measure Help from another person eating meals?: None Help from another person taking care of personal grooming?: A Little Help from another person toileting, which includes using toliet, bedpan, or urinal?: A Lot Help from another person bathing (including washing, rinsing, drying)?: A Lot Help from another person to put on and taking off regular upper body clothing?: A Lot Help from another person to put on and taking off regular lower body clothing?: A Lot 6 Click Score: 15    End of Session Nurse Communication:  (pain)  Activity Tolerance: Patient limited by pain Patient left: in bed;with call bell/phone within reach;with bed alarm set  OT Visit Diagnosis: Unsteadiness on feet (R26.81);Other abnormalities of gait and mobility (R26.89);Pain Pain - Right/Left: Right Pain - part of body:  (ribs)                Time: 8250-0370 OT Time Calculation (min): 26 min Charges:  OT General Charges $OT Visit: 1 Visit OT Evaluation $OT Eval Low Complexity: 1 Low OT Treatments $Self Care/Home Management : 8-22 mins  Joeseph Amor OTR/L  Acute Rehab Services  [phone] office number [phone] pager number   Joeseph Amor 05/28/2021, 12:09 PM

## 2021-05-28 NOTE — Discharge Summary (Signed)
Physician Discharge Summary  Patient ID: DRACEN REIGLE MRN: [id number] DOB/AGE: 01/30/1951 70 y.o.  Admit date: 05/27/2021 Discharge date: 05/28/2021  Discharge Diagnoses Fall from ladder Right rib fractures Trace right hemothorax  Consultants None   Procedures None   HPI: Patient is a 70 y/o M who presented as a level 1 trauma from med center [place]. He was at work, on a ladder, when he reached for something and fell around 1330 on day of presentation. He denied LOC. He drove himself home but, due to persistent R sided posterior chest wall pain, he presented [facility]. In the ED, he was noted to have multiple right posterior rib fractures and hypotension. He was given a unit of blood and transferred to [facility] for further evaluation. Primary complaint was right sided chest/back pain associated with SOB.    Reported PMH HTN. Denied use of blood thinners. 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 Denied other drug use. He lives at home with his wife.   Hospital Course: Patient was admitted to the trauma service for pain control and PT/OT. Follow up CXR 10/18 was without significant PTX or HTX. Pain control significantly improved by afternoon 10/18. OT evaluated but patient was limited by pain in the morning. PT evaluated patient and recommended intermittent supervision and no follow up. Rolling walker ordered per recommendations. Patient was maintaining oxygen saturation on room air and felt comfortable with discharge home. He is aware to follow up with PCP and he can call trauma office with concerns. Patient discharged in stable condition.   I or a member of my team have reviewed this patient in the Controlled Substance Database   Allergies as of 05/28/2021   No Known Allergies      Medication List     TAKE these medications    acetaminophen 325 MG tablet Commonly known as:  TYLENOL Take 2 tablets (650 mg total) by mouth every 6 (six) hours as needed for mild pain.   amLODipine 10 MG tablet Commonly known as: NORVASC Take 10 mg by mouth daily.   docusate sodium 100 MG capsule Commonly known as: COLACE Take 1 capsule (100 mg total) by mouth daily as needed for mild constipation.   ferrous sulfate 324 MG Tbec Take 324 mg by mouth daily at 12 noon.   ibuprofen 600 MG tablet Commonly known as: ADVIL Take 1 tablet (600 mg total) by mouth every 8 (eight) hours as needed for moderate pain.   irbesartan 75 MG tablet Commonly known as: AVAPRO Take 1 tablet (75 mg total) by mouth daily.   lidocaine 5 % Commonly known as: LIDODERM Place 1 patch onto the skin daily. Remove & Discard patch within 12 hours or as directed by MD Start taking on: May 29, 2021   methocarbamol 500 MG tablet Commonly known as: ROBAXIN Take 2 tablets (1,000 mg total) by mouth every 6 (six) hours as needed for muscle spasms (pain).   oxyCODONE 5 MG immediate release tablet Commonly known as: Oxy IR/ROXICODONE Take 1-2 tablets (5-10 mg total) by mouth every 6 (six) hours as needed for moderate pain or severe pain.   PROBIOTIC PO Take 1 tablet by mouth daily at 12 noon.   sertraline 50 MG tablet Commonly known as: ZOLOFT Take 50 mg by mouth daily.               Durable Medical Equipment  (From admission, onward)  Start     Ordered   05/28/21 1526  For home use only DME Walker rolling  Once       Question Answer Comment  Walker: With 5 Inch Wheels   Patient needs a walker to treat with the following condition Multiple rib fractures      05/28/21 1525              Follow-up Information     Rankins, Bill Salinas, MD Follow up.   Specialty: Family Medicine Why: Call and follow up as needed for management of pain related to rib fractures. Contact information: [place] [address] [phone]         [employer] [place]. Call.   Why: As needed with questions. No follow up scheduled. Contact information: [place] [address] [phone]                Signed: Norm Parcel , [facility] Surgery 05/28/2021, 3:30 PM Please see Amion for pager number during day hours 7:00am-4:30pm

## 2021-05-28 NOTE — Evaluation (Signed)
Physical Therapy Evaluation Patient Details Name: Richard Hicks MRN: [id number] DOB: 27-Sep-1950 Today's Date: 05/28/2021  History of Present Illness  Pt is a 70 y/o male who presents after falling off a ladder 8-10 feet. Pt found to have multiple Rt posterior Rib fxs 4-9, hypotension and possible occult pneumothorax and HTX. PMH but not limited to: HTN, syncope.  Clinical Impression  Patient presents with pain, decreased oxygen saturation and impaired mobility s/p above. Pt active and lives at home with wife, works as a Chief Strategy Officer. Today, pt requires min guard -supervision for mobility and requires use of RW for support. SP02 ranged from 86-92% on RA throughout session with cues needed for pursed lip breathing. Encouraged IS throughout the day and getting up to chair later. Will need to practice getting in/out of bed with HOB flat, no rails and stair training next session prior to d/c. Will follow acutely to maximize independence and mobility prior to return home.     Recommendations for follow up therapy are one component of a multi-disciplinary discharge planning process, led by the attending physician.  Recommendations may be updated based on patient status, additional functional criteria and insurance authorization.  Follow Up Recommendations No PT follow up;Supervision - Intermittent    Equipment Recommendations  Rolling walker with 5" wheels    Recommendations for Other Services       Precautions / Restrictions Precautions Precautions: Other (comment) Precaution Comments: watch 02 Restrictions Weight Bearing Restrictions: No      Mobility  Bed Mobility Overal bed mobility: Needs Assistance Bed Mobility: Supine to Sit;Sit to Sidelying Rolling: Supervision   Supine to sit: Supervision;HOB elevated   Sit to sidelying: Supervision;HOB elevated General bed mobility comments: Cues for technique, to reach for rail and increased time but no assist needed.     Transfers Overall transfer level: Needs assistance Equipment used: Rolling walker (2 wheeled) Transfers: Sit to/from Stand Sit to Stand: Min guard         General transfer comment: Min guard for safety. Stood from elevated bed height x1, not able to stand well from low surface.  Ambulation/Gait Ambulation/Gait assistance: Supervision Gait Distance (Feet): 200 Feet Assistive device: Rolling walker (2 wheeled) Gait Pattern/deviations: Step-through pattern;Decreased stride length Gait velocity: decreased Gait velocity interpretation: <1.31 ft/sec, indicative of household ambulator General Gait Details: Slow, guarded gait with RW for support; increased pain with turning right. Sp02 86%-92% on RA throughout with cues for breathing,  Stairs            Wheelchair Mobility    Modified Rankin (Stroke Patients Only)       Balance Overall balance assessment: Needs assistance Sitting-balance support: Feet supported;No upper extremity supported Sitting balance-Leahy Scale: Good Sitting balance - Comments: guarded sitting EOB   Standing balance support: During functional activity Standing balance-Leahy Scale: Fair Standing balance comment: Able to stand statically but does better with UE support for dynamic tasks.                             Pertinent Vitals/Pain Pain Assessment: Faces Pain Score: 8  Faces Pain Scale: Hurts even more Pain Location: Right ribs posteriorly in back with movement Pain Descriptors / Indicators: Grimacing;Guarding;Sore;Aching Pain Intervention(s): Monitored during session;Limited activity within patient's tolerance;Premedicated before session;Repositioned    Home Living Family/patient expects to be discharged to:: Private residence Living Arrangements: Spouse/significant other Available Help at Discharge: Family;Available PRN/intermittently Type of Home: House Home Access: Stairs to enter Entrance  Stairs-Rails: Right Entrance  Stairs-Number of Steps: a few Home Layout: One level Home Equipment: None      Prior Function Level of Independence: Independent         Comments: Works as a Chief Strategy Officer, independent     Journalist, newspaper   Dominant Hand: Right    Extremity/Trunk Assessment   Upper Extremity Assessment Upper Extremity Assessment: Defer to OT evaluation RUE Deficits / Details: limited due to guarding with pain LUE Deficits / Details: limited due to guarding with pain    Lower Extremity Assessment Lower Extremity Assessment: Overall WFL for tasks assessed    Cervical / Trunk Assessment Cervical / Trunk Assessment: Normal  Communication   Communication: No difficulties  Cognition Arousal/Alertness: Awake/alert Behavior During Therapy: WFL for tasks assessed/performed Overall Cognitive Status: Within Functional Limits for tasks assessed                                        General Comments General comments (skin integrity, edema, etc.): Family present during session.    Exercises     Assessment/Plan    PT Assessment Patient needs continued PT services  PT Problem List Pain;Cardiopulmonary status limiting activity;Decreased activity tolerance;Decreased mobility       PT Treatment Interventions Therapeutic exercise;DME instruction;Stair training;Gait training;Balance training;Patient/family education;Therapeutic activities;Functional mobility training    PT Goals (Current goals can be found in the Care Plan section)  Acute Rehab PT Goals Patient Stated Goal: to decrease pain PT Goal Formulation: With patient Time For Goal Achievement: 06/11/21 Potential to Achieve Goals: Good    Frequency Min 4X/week   Barriers to discharge Inaccessible home environment stairs    Co-evaluation               AM-PAC PT "6 Clicks" Mobility  Outcome Measure Help needed turning from your back to your side while in a flat bed without using bedrails?: A Little Help  needed moving from lying on your back to sitting on the side of a flat bed without using bedrails?: A Little Help needed moving to and from a bed to a chair (including a wheelchair)?: A Little Help needed standing up from a chair using your arms (e.g., wheelchair or bedside chair)?: A Little Help needed to walk in hospital room?: A Little Help needed climbing 3-5 steps with a railing? : A Little 6 Click Score: 18    End of Session   Activity Tolerance: Patient tolerated treatment well;Other (comment) (slight drop in Sp02) Patient left: in bed;with call bell/phone within reach;with family/visitor present Nurse Communication: Mobility status PT Visit Diagnosis: Pain;Difficulty in walking, not elsewhere classified (R26.2) Pain - Right/Left: Right Pain - part of body:  (back/ribs)    Time: 6761-9509 PT Time Calculation (min) (ACUTE ONLY): 34 min   Charges:   PT Evaluation $PT Eval Moderate Complexity: 1 Mod PT Treatments $Gait Training: 8-22 mins        Marisa Severin, PT, DPT Acute Rehabilitation Services Pager [phone] Office [phone]     [place] 05/28/2021, 12:37 PM

## 2021-05-28 NOTE — TOC CAGE-AID Note (Signed)
Transition of Care [facility]) - CAGE-AID Screening   Patient Details  Name: Richard Hicks MRN: [id number] Date of Birth: 1950/09/01  Clinical Narrative:  Patient endorses occasional alcohol use, but denies and problems with excessive use. Denies any drug use. Patient is tearful over events and therapeutic listening was provided. Patient states no need for substance abuse education at this time.  CAGE-AID Screening:    Have You Ever Felt You Ought to Cut Down on Your Drinking or Drug Use?: No Have People Annoyed You By Critizing Your Drinking Or Drug Use?: No Have You Felt Bad Or Guilty About Your Drinking Or Drug Use?: No Have You Ever Had a Drink or Used Drugs First Thing In The Morning to Steady Your Nerves or to Get Rid of a Hangover?: No CAGE-AID Score: 0  Substance Abuse Education Offered: Yes

## 2021-05-28 NOTE — Plan of Care (Signed)
  Problem: Elimination: Goal: Will not experience complications related to bowel motility Outcome: Progressing   Problem: Elimination: Goal: Will not experience complications related to urinary retention Outcome: Progressing   

## 2021-05-28 NOTE — Progress Notes (Signed)
Progress Note     Subjective: Patient reports pain in R chest and back. Just tried to work with OT but was significantly limited by pain. Patient reports he was using IS all night and is able to pull to 1500. He is very motivated to get up and start mobilizing but just afraid it will be very painful. He denies pain elsewhere and denies nausea. Not much appetite secondary to pain. He reports he was having some issues with diarrhea prior to being in the hospital and was taking imodium once daily.   Objective: Vital signs in last 24 hours: Temp:  [97.5 F (36.4 C)-98.9 F (37.2 C)] 98.4 F (36.9 C) (10/18 0732) Pulse Rate:  [55-70] 65 (10/18 0732) Resp:  [16-22] 17 (10/18 0732) BP: (74-138)/(46-86) 106/70 (10/18 0732) SpO2:  [90 %-100 %] 90 % (10/18 0732) Weight:  [80.7 kg] 80.7 kg (10/17 1541) Last BM Date: 05/26/21  Intake/Output from previous day: 10/17 0701 - 10/18 0700 In: 2398.4 [P.O.:120; I.V.:2278.4] Out: -  Intake/Output this shift: No intake/output data recorded.  PE: General: pleasant, WD, WN male who is laying in bed and appears uncomfortable HEENT: head is normocephalic, atraumatic.  Sclera are noninjected. Ears and nose without any masses or lesions.  Mouth is pink and moist Heart: regular, rate, and rhythm.  Lungs: lungs CTAB, shallow breaths, O2 sat 89-92% on room air  Abd: soft, NT, ND, +BS MS: all 4 extremities are symmetrical with no cyanosis, clubbing, or edema. Skin: warm and dry with no masses, lesions, or rashes Neuro: Cranial nerves 2-12 grossly intact, sensation is normal throughout Psych: A&Ox3 with an appropriate affect.    Lab Results:  Recent Labs    05/27/21 1515 05/28/21 0152  WBC 12.3* 10.4  HGB 13.8 12.6*  HCT 41.8 38.5*  PLT 270 214   BMET Recent Labs    05/27/21 1515 05/28/21 0152  NA 139 135  K 4.3 3.8  CL 102 104  CO2 26 24  GLUCOSE 124* 119*  BUN 20 13  CREATININE 1.08 0.77  CALCIUM 9.6 8.2*   PT/INR Recent Labs     05/27/21 1515  LABPROT 12.8  INR 1.0   CMP     Component Value Date/Time   NA 135 05/28/2021 0152   K 3.8 05/28/2021 0152   CL 104 05/28/2021 0152   CO2 24 05/28/2021 0152   GLUCOSE 119 (H) 05/28/2021 0152   BUN 13 05/28/2021 0152   CREATININE 0.77 05/28/2021 0152   CALCIUM 8.2 (L) 05/28/2021 0152   PROT 7.4 05/27/2021 1515   ALBUMIN 4.5 05/27/2021 1515   AST 23 05/27/2021 1515   ALT 19 05/27/2021 1515   ALKPHOS 144 (H) 05/27/2021 1515   BILITOT 0.6 05/27/2021 1515   GFRNONAA >60 05/28/2021 0152   Lipase  No results found for: LIPASE     Studies/Results: CT HEAD WO CONTRAST  Result Date: 05/27/2021 CLINICAL DATA:  [place] off a ladder. EXAM: CT HEAD WITHOUT CONTRAST CT CERVICAL SPINE WITHOUT CONTRAST TECHNIQUE: Multidetector CT imaging of the head and cervical spine was performed following the standard protocol without intravenous contrast. Multiplanar CT image reconstructions of the cervical spine were also generated. COMPARISON:  None. FINDINGS: CT HEAD FINDINGS Brain: No evidence of acute infarction, hemorrhage, hydrocephalus, extra-axial collection or mass lesion/mass effect. Vascular: Atherosclerotic vascular calcification of the carotid siphons. No hyperdense vessel. Skull: Normal. Negative for fracture or focal lesion. Sinuses/Orbits: Scattered paranasal sinus mucosal thickening. Bilateral maxillary sinus retention cysts. Right maxillary sinus air-fluid  level. Other: None. CT CERVICAL SPINE FINDINGS Alignment: No traumatic malalignment. Trace retrolisthesis at C3-C4. Trace anterolisthesis at C7-T1. Skull base and vertebrae: No acute fracture. No primary bone lesion or focal pathologic process. Soft tissues and spinal canal: No prevertebral fluid or swelling. No visible canal hematoma. Disc levels: Multilevel disc height loss and uncovertebral hypertrophy, moderate to severe at C3-C4. Advanced facet arthropathy at C7-T1. Upper chest: Please see separate CT chest, abdomen, and  pelvis report from same day. Other: None. IMPRESSION: 1. No acute intracranial abnormality. 2. No acute cervical spine fracture or traumatic listhesis. 3. Paranasal sinus disease with right maxillary sinus air-fluid level. Correlate for acute sinusitis. These results were discussed in person at the time of interpretation on 05/27/2021 at 4:42 pm with provider Georganna Skeans, who verbally acknowledged these results. Electronically Signed   By: Titus Dubin M.D.   On: 05/27/2021 16:56   CT CHEST W CONTRAST  Result Date: 05/27/2021 CLINICAL DATA:  Fall from ladder.  Chest and abdominal trauma EXAM: CT CHEST, ABDOMEN, AND PELVIS WITH CONTRAST TECHNIQUE: Multidetector CT imaging of the chest, abdomen and pelvis was performed following the standard protocol during bolus administration of intravenous contrast. CONTRAST:  188mL OMNIPAQUE IOHEXOL 350 MG/ML SOLN COMPARISON:  Same day chest and abdominal radiographs. FINDINGS: CT CHEST FINDINGS Cardiovascular: Heart size is upper limits of normal. No pericardial effusion. Thoracic aorta is nonaneurysmal. Scattered atherosclerotic calcifications of the aorta and coronary arteries. Central pulmonary vasculature is within normal limits. Mediastinum/Nodes: Thyroid gland unremarkable. No axillary, mediastinal, or hilar lymphadenopathy. Moderate-sized hiatal hernia. Esophagus within normal limits. Unremarkable trachea. Lungs/Pleura: Dependent subsegmental atelectasis within both lungs. A component of right-sided pulmonary contusion not excluded. No pleural effusion. Tiny right-sided pneumothorax. No left-sided pneumothorax. Musculoskeletal: Acute moderately displaced fractures of the posterior right fourth and fifth ribs. Additional nondisplaced fractures of the right sixth through ninth ribs. Segmental fractures include the right fourth through eighth ribs. Subcutaneous emphysema overlies the right lateral chest wall thoracic vertebral body heights and alignment are  maintained without evidence of fracture. No left-sided rib fractures are identified. CT ABDOMEN PELVIS FINDINGS Hepatobiliary: No hepatic injury or perihepatic hematoma. There is a tiny amount of air along the anterolateral margin of the liver which is favored to be the inferior aspect of the pleural space. Gallbladder is unremarkable. Pancreas: Mild pancreatic ductal dilatation. No focal pancreatic abnormality. No peripancreatic inflammatory changes. Spleen: No splenic injury or perisplenic hematoma. Adrenals/Urinary Tract: No adrenal hemorrhage or renal injury identified. Bladder is unremarkable. Stomach/Bowel: Moderate hiatal hernia. Stomach otherwise unremarkable. No dilated loops of bowel. No focal bowel wall thickening or inflammatory changes. Vascular/Lymphatic: Scattered aortoiliac atherosclerotic calcifications without aneurysm. No abdominopelvic lymphadenopathy. Reproductive: Prostate is unremarkable. Other: No free fluid. No abdominopelvic fluid collection. No pneumoperitoneum. No abdominal wall hernia. Musculoskeletal: Lumbar vertebral body heights and alignment are maintained without evidence of fracture. Pelvic bony ring intact without fracture or diastasis. Bilateral hips are intact without fracture or dislocation. IMPRESSION: 1. Acute moderately displaced fractures of the posterior right fourth and fifth ribs. Additional nondisplaced fractures of the right sixth through ninth ribs. Segmental fractures include the right fourth through eighth ribs which raise the possibility of a flail segment. 2. Tiny right-sided pneumothorax. 3. Dependent subsegmental atelectasis within both lungs. A component of right-sided pulmonary contusion not excluded. 4. No acute findings within the abdomen or pelvis. 5. Moderate-sized hiatal hernia. Aortic Atherosclerosis (ICD10-I70.0). Electronically Signed   By: Davina Poke D.O.   On: 05/27/2021 17:02   CT CERVICAL SPINE  WO CONTRAST  Result Date:  05/27/2021 CLINICAL DATA:  [place] off a ladder. EXAM: CT HEAD WITHOUT CONTRAST CT CERVICAL SPINE WITHOUT CONTRAST TECHNIQUE: Multidetector CT imaging of the head and cervical spine was performed following the standard protocol without intravenous contrast. Multiplanar CT image reconstructions of the cervical spine were also generated. COMPARISON:  None. FINDINGS: CT HEAD FINDINGS Brain: No evidence of acute infarction, hemorrhage, hydrocephalus, extra-axial collection or mass lesion/mass effect. Vascular: Atherosclerotic vascular calcification of the carotid siphons. No hyperdense vessel. Skull: Normal. Negative for fracture or focal lesion. Sinuses/Orbits: Scattered paranasal sinus mucosal thickening. Bilateral maxillary sinus retention cysts. Right maxillary sinus air-fluid level. Other: None. CT CERVICAL SPINE FINDINGS Alignment: No traumatic malalignment. Trace retrolisthesis at C3-C4. Trace anterolisthesis at C7-T1. Skull base and vertebrae: No acute fracture. No primary bone lesion or focal pathologic process. Soft tissues and spinal canal: No prevertebral fluid or swelling. No visible canal hematoma. Disc levels: Multilevel disc height loss and uncovertebral hypertrophy, moderate to severe at C3-C4. Advanced facet arthropathy at C7-T1. Upper chest: Please see separate CT chest, abdomen, and pelvis report from same day. Other: None. IMPRESSION: 1. No acute intracranial abnormality. 2. No acute cervical spine fracture or traumatic listhesis. 3. Paranasal sinus disease with right maxillary sinus air-fluid level. Correlate for acute sinusitis. These results were discussed in person at the time of interpretation on 05/27/2021 at 4:42 pm with provider Georganna Skeans, who verbally acknowledged these results. Electronically Signed   By: Titus Dubin M.D.   On: 05/27/2021 16:56   CT ABDOMEN PELVIS W CONTRAST  Result Date: 05/27/2021 CLINICAL DATA:  Fall from ladder.  Chest and abdominal trauma EXAM: CT CHEST,  ABDOMEN, AND PELVIS WITH CONTRAST TECHNIQUE: Multidetector CT imaging of the chest, abdomen and pelvis was performed following the standard protocol during bolus administration of intravenous contrast. CONTRAST:  162mL OMNIPAQUE IOHEXOL 350 MG/ML SOLN COMPARISON:  Same day chest and abdominal radiographs. FINDINGS: CT CHEST FINDINGS Cardiovascular: Heart size is upper limits of normal. No pericardial effusion. Thoracic aorta is nonaneurysmal. Scattered atherosclerotic calcifications of the aorta and coronary arteries. Central pulmonary vasculature is within normal limits. Mediastinum/Nodes: Thyroid gland unremarkable. No axillary, mediastinal, or hilar lymphadenopathy. Moderate-sized hiatal hernia. Esophagus within normal limits. Unremarkable trachea. Lungs/Pleura: Dependent subsegmental atelectasis within both lungs. A component of right-sided pulmonary contusion not excluded. No pleural effusion. Tiny right-sided pneumothorax. No left-sided pneumothorax. Musculoskeletal: Acute moderately displaced fractures of the posterior right fourth and fifth ribs. Additional nondisplaced fractures of the right sixth through ninth ribs. Segmental fractures include the right fourth through eighth ribs. Subcutaneous emphysema overlies the right lateral chest wall thoracic vertebral body heights and alignment are maintained without evidence of fracture. No left-sided rib fractures are identified. CT ABDOMEN PELVIS FINDINGS Hepatobiliary: No hepatic injury or perihepatic hematoma. There is a tiny amount of air along the anterolateral margin of the liver which is favored to be the inferior aspect of the pleural space. Gallbladder is unremarkable. Pancreas: Mild pancreatic ductal dilatation. No focal pancreatic abnormality. No peripancreatic inflammatory changes. Spleen: No splenic injury or perisplenic hematoma. Adrenals/Urinary Tract: No adrenal hemorrhage or renal injury identified. Bladder is unremarkable. Stomach/Bowel:  Moderate hiatal hernia. Stomach otherwise unremarkable. No dilated loops of bowel. No focal bowel wall thickening or inflammatory changes. Vascular/Lymphatic: Scattered aortoiliac atherosclerotic calcifications without aneurysm. No abdominopelvic lymphadenopathy. Reproductive: Prostate is unremarkable. Other: No free fluid. No abdominopelvic fluid collection. No pneumoperitoneum. No abdominal wall hernia. Musculoskeletal: Lumbar vertebral body heights and alignment are maintained without evidence of fracture. Pelvic  bony ring intact without fracture or diastasis. Bilateral hips are intact without fracture or dislocation. IMPRESSION: 1. Acute moderately displaced fractures of the posterior right fourth and fifth ribs. Additional nondisplaced fractures of the right sixth through ninth ribs. Segmental fractures include the right fourth through eighth ribs which raise the possibility of a flail segment. 2. Tiny right-sided pneumothorax. 3. Dependent subsegmental atelectasis within both lungs. A component of right-sided pulmonary contusion not excluded. 4. No acute findings within the abdomen or pelvis. 5. Moderate-sized hiatal hernia. Aortic Atherosclerosis (ICD10-I70.0). Electronically Signed   By: Davina Poke D.O.   On: 05/27/2021 17:02   DG Pelvis Portable  Result Date: 05/27/2021 CLINICAL DATA:  Fall from ladder. EXAM: PORTABLE PELVIS 1-2 VIEWS COMPARISON:  None. FINDINGS: There is no evidence of pelvic fracture or diastasis. Bilateral hips are intact without fracture or dislocation. Degenerative disc disease within the visualized lower lumbar spine. IMPRESSION: Negative. Electronically Signed   By: Davina Poke D.O.   On: 05/27/2021 15:50   DG Chest Port 1 View  Result Date: 05/28/2021 CLINICAL DATA:  Right pneumothorax EXAM: PORTABLE CHEST 1 VIEW COMPARISON:  Chest radiograph and chest CT dated 1 day prior FINDINGS: The heart is enlarged, unchanged. The mediastinal contours are stable. There is  no new focal airspace disease. A small amount of lucency is again seen in the right chest wall lateral to the second and third ribs. No pneumothorax is seen. There is no left pneumothorax. There is no pleural effusion. Displaced fractures of the right fourth and fifth ribs are again seen. Additional nondisplaced rib fractures are better seen on the prior CT. IMPRESSION: Unchanged lucency just lateral to the right second and third ribs. No pneumothorax identified. Electronically Signed   By: Valetta Mole M.D.   On: 05/28/2021 08:04   DG Chest Port 1 View  Result Date: 05/27/2021 CLINICAL DATA:  Fall from ladder approximately 8-10 feet by report. EXAM: PORTABLE CHEST 1 VIEW COMPARISON:  None FINDINGS: Multiple overlapping rib fractures of RIGHT-sided ribs are noted with subcutaneous emphysema. No definable pleural line is visualized in the RIGHT chest but suspect pneumothorax given the presence of subcutaneous emphysema along the RIGHT chest wall. No lobar consolidation. Chest is incompletely imaged with respect to LEFT lateral chest. RIGHT-sided rib fractures involve fourth fifth, sixth and seventh as well as eighth ribs with overlapping and considerable displacement. Segmental fractures of the seventh and eighth ribs. Chest incompletely visualized in terms of bony thorax. Cardiomediastinal contours with respect to visualized portions are unremarkable. LEFT chest excluded from view as described. IMPRESSION: Multiple overlapping rib fractures of the RIGHT-sided ribs with subcutaneous emphysema. Suspect otherwise occult pneumothorax on the RIGHT. Chest CT or full trauma assessment as warranted is suggested given that the chest is incompletely imaged and there are findings of extensive trauma to the RIGHT hemithorax. These results were called by telephone at the time of interpretation on 05/27/2021 at 4:01 pm to provider MELANIE BELFI , who verbally acknowledged these results. At this time the patient is currently  in route to a trauma center for complete assessment per the provider. Electronically Signed   By: Zetta Bills M.D.   On: 05/27/2021 16:01    Anti-infectives: Anti-infectives (From admission, onward)    None        Assessment/Plan 70 y.o M s/p fall 7 feet from ladder, no LOC Right posterior rib fractures 4-9 with occult PTX and trace HTX - multimodal pain control, IS/Pulm toilet, CXR without PTX EtOH use -  CIWA HTN - hold with low BP, can restart home meds tomorrow if BP improving   FEN: Reg diet, SLIV Pain: scheduled tylenol, add scheduled motrin and robaxin this AM, lidoderm patch, prn oxycodone ID: none VTE: SCD's, LMWH Foley: none  Dispo: Pain control, PT/OT.   LOS: 1 day    Norm Parcel, [facility] 05/28/2021, 9:03 AM Please see Amion for pager number during day hours 7:00am-4:30pm

## 2021-05-28 NOTE — Care Management (Signed)
Patient from home with wife. Ordered rolling walker with Freda Munro with [employer]

## 2021-05-28 NOTE — Progress Notes (Signed)
Discharge instructions given to pt. Pt verbalized understanding of all teaching. Pt discharged to home with wife via wheelchair with all belongings. Walker delivered to pt room and taken with him at discharge.

## 2021-05-29 LAB — BPAM RBC
Blood Product Expiration Date: 202211172359
Unit Type and Rh: 5100

## 2021-05-29 LAB — TYPE AND SCREEN
ABO/RH(D): O POS
Antibody Screen: NEGATIVE
Unit division: 0

## 2021-06-03 DIAGNOSIS — S270XXD Traumatic pneumothorax, subsequent encounter: Secondary | ICD-10-CM | POA: Diagnosis not present

## 2021-06-03 DIAGNOSIS — S2249XA Multiple fractures of ribs, unspecified side, initial encounter for closed fracture: Secondary | ICD-10-CM | POA: Diagnosis not present

## 2021-06-03 DIAGNOSIS — F331 Major depressive disorder, recurrent, moderate: Secondary | ICD-10-CM | POA: Diagnosis not present

## 2021-06-03 DIAGNOSIS — W11XXXD Fall on and from ladder, subsequent encounter: Secondary | ICD-10-CM | POA: Diagnosis not present

## 2021-06-05 DIAGNOSIS — M545 Low back pain, unspecified: Secondary | ICD-10-CM | POA: Diagnosis not present

## 2021-06-06 DIAGNOSIS — Z8709 Personal history of other diseases of the respiratory system: Secondary | ICD-10-CM | POA: Diagnosis not present

## 2021-06-06 DIAGNOSIS — Z09 Encounter for follow-up examination after completed treatment for conditions other than malignant neoplasm: Secondary | ICD-10-CM | POA: Diagnosis not present

## 2021-06-10 DIAGNOSIS — F331 Major depressive disorder, recurrent, moderate: Secondary | ICD-10-CM | POA: Diagnosis not present

## 2021-06-17 DIAGNOSIS — F331 Major depressive disorder, recurrent, moderate: Secondary | ICD-10-CM | POA: Diagnosis not present

## 2021-06-18 DIAGNOSIS — M545 Low back pain, unspecified: Secondary | ICD-10-CM | POA: Diagnosis not present

## 2021-06-25 DIAGNOSIS — M545 Low back pain, unspecified: Secondary | ICD-10-CM | POA: Diagnosis not present

## 2021-06-27 DIAGNOSIS — M545 Low back pain, unspecified: Secondary | ICD-10-CM | POA: Diagnosis not present

## 2021-07-11 DIAGNOSIS — M545 Low back pain, unspecified: Secondary | ICD-10-CM | POA: Diagnosis not present

## 2021-07-15 NOTE — Progress Notes (Signed)
Please ask the ordering provider, Dr. Tamera Punt. Georganna Skeans, MD, MPH, FACS Please use [email or web address] to contact on call provider

## 2021-07-15 NOTE — Progress Notes (Signed)
PT/OT is not a test. This is physical therapy and occupational therapy. Georganna Skeans, MD, MPH, FACS Please use [email or web address] to contact on call provider

## 2021-07-17 DIAGNOSIS — F419 Anxiety disorder, unspecified: Secondary | ICD-10-CM | POA: Diagnosis not present

## 2021-07-24 DIAGNOSIS — F419 Anxiety disorder, unspecified: Secondary | ICD-10-CM | POA: Diagnosis not present

## 2021-07-25 DIAGNOSIS — U071 COVID-19: Secondary | ICD-10-CM | POA: Diagnosis not present

## 2021-07-25 DIAGNOSIS — R0981 Nasal congestion: Secondary | ICD-10-CM | POA: Diagnosis not present

## 2021-07-25 DIAGNOSIS — R059 Cough, unspecified: Secondary | ICD-10-CM | POA: Diagnosis not present

## 2021-07-29 DIAGNOSIS — E7800 Pure hypercholesterolemia, unspecified: Secondary | ICD-10-CM | POA: Diagnosis not present

## 2021-07-29 DIAGNOSIS — F321 Major depressive disorder, single episode, moderate: Secondary | ICD-10-CM | POA: Diagnosis not present

## 2021-07-29 DIAGNOSIS — E785 Hyperlipidemia, unspecified: Secondary | ICD-10-CM | POA: Diagnosis not present

## 2021-07-29 DIAGNOSIS — I10 Essential (primary) hypertension: Secondary | ICD-10-CM | POA: Diagnosis not present

## 2021-08-15 DIAGNOSIS — F419 Anxiety disorder, unspecified: Secondary | ICD-10-CM | POA: Diagnosis not present

## 2021-08-20 DIAGNOSIS — F321 Major depressive disorder, single episode, moderate: Secondary | ICD-10-CM | POA: Diagnosis not present

## 2021-08-20 DIAGNOSIS — Z23 Encounter for immunization: Secondary | ICD-10-CM | POA: Diagnosis not present

## 2021-08-20 DIAGNOSIS — E7800 Pure hypercholesterolemia, unspecified: Secondary | ICD-10-CM | POA: Diagnosis not present

## 2021-08-20 DIAGNOSIS — I10 Essential (primary) hypertension: Secondary | ICD-10-CM | POA: Diagnosis not present

## 2021-08-22 DIAGNOSIS — D225 Melanocytic nevi of trunk: Secondary | ICD-10-CM | POA: Diagnosis not present

## 2021-08-22 DIAGNOSIS — L570 Actinic keratosis: Secondary | ICD-10-CM | POA: Diagnosis not present

## 2021-08-22 DIAGNOSIS — L821 Other seborrheic keratosis: Secondary | ICD-10-CM | POA: Diagnosis not present

## 2021-08-22 DIAGNOSIS — L578 Other skin changes due to chronic exposure to nonionizing radiation: Secondary | ICD-10-CM | POA: Diagnosis not present

## 2021-08-22 DIAGNOSIS — D3610 Benign neoplasm of peripheral nerves and autonomic nervous system, unspecified: Secondary | ICD-10-CM | POA: Diagnosis not present

## 2021-08-22 DIAGNOSIS — Z23 Encounter for immunization: Secondary | ICD-10-CM | POA: Diagnosis not present

## 2021-08-28 DIAGNOSIS — F419 Anxiety disorder, unspecified: Secondary | ICD-10-CM | POA: Diagnosis not present

## 2021-09-02 DIAGNOSIS — M545 Low back pain, unspecified: Secondary | ICD-10-CM | POA: Diagnosis not present

## 2021-09-11 DIAGNOSIS — F419 Anxiety disorder, unspecified: Secondary | ICD-10-CM | POA: Diagnosis not present

## 2021-09-11 DIAGNOSIS — M545 Low back pain, unspecified: Secondary | ICD-10-CM | POA: Diagnosis not present

## 2021-09-16 DIAGNOSIS — M545 Low back pain, unspecified: Secondary | ICD-10-CM | POA: Diagnosis not present

## 2021-09-25 DIAGNOSIS — F419 Anxiety disorder, unspecified: Secondary | ICD-10-CM | POA: Diagnosis not present

## 2021-10-07 DIAGNOSIS — M545 Low back pain, unspecified: Secondary | ICD-10-CM | POA: Diagnosis not present

## 2021-10-09 DIAGNOSIS — F419 Anxiety disorder, unspecified: Secondary | ICD-10-CM | POA: Diagnosis not present

## 2021-10-23 DIAGNOSIS — F419 Anxiety disorder, unspecified: Secondary | ICD-10-CM | POA: Diagnosis not present

## 2021-11-06 DIAGNOSIS — F419 Anxiety disorder, unspecified: Secondary | ICD-10-CM | POA: Diagnosis not present

## 2021-11-11 DIAGNOSIS — M545 Low back pain, unspecified: Secondary | ICD-10-CM | POA: Diagnosis not present

## 2021-11-22 DIAGNOSIS — M545 Low back pain, unspecified: Secondary | ICD-10-CM | POA: Diagnosis not present

## 2021-11-27 DIAGNOSIS — F419 Anxiety disorder, unspecified: Secondary | ICD-10-CM | POA: Diagnosis not present

## 2021-12-03 DIAGNOSIS — H2513 Age-related nuclear cataract, bilateral: Secondary | ICD-10-CM | POA: Diagnosis not present

## 2021-12-03 DIAGNOSIS — H1711 Central corneal opacity, right eye: Secondary | ICD-10-CM | POA: Diagnosis not present

## 2021-12-18 DIAGNOSIS — F419 Anxiety disorder, unspecified: Secondary | ICD-10-CM | POA: Diagnosis not present

## 2021-12-26 DIAGNOSIS — F331 Major depressive disorder, recurrent, moderate: Secondary | ICD-10-CM | POA: Diagnosis not present

## 2021-12-27 DIAGNOSIS — M545 Low back pain, unspecified: Secondary | ICD-10-CM | POA: Diagnosis not present

## 2022-01-01 DIAGNOSIS — F419 Anxiety disorder, unspecified: Secondary | ICD-10-CM | POA: Diagnosis not present

## 2022-01-10 DIAGNOSIS — M545 Low back pain, unspecified: Secondary | ICD-10-CM | POA: Diagnosis not present

## 2022-01-15 DIAGNOSIS — M545 Low back pain, unspecified: Secondary | ICD-10-CM | POA: Diagnosis not present

## 2022-01-15 DIAGNOSIS — F419 Anxiety disorder, unspecified: Secondary | ICD-10-CM | POA: Diagnosis not present

## 2022-02-05 DIAGNOSIS — F419 Anxiety disorder, unspecified: Secondary | ICD-10-CM | POA: Diagnosis not present

## 2022-02-19 DIAGNOSIS — F419 Anxiety disorder, unspecified: Secondary | ICD-10-CM | POA: Diagnosis not present

## 2022-02-28 DIAGNOSIS — M545 Low back pain, unspecified: Secondary | ICD-10-CM | POA: Diagnosis not present

## 2022-03-05 DIAGNOSIS — F419 Anxiety disorder, unspecified: Secondary | ICD-10-CM | POA: Diagnosis not present

## 2022-03-19 DIAGNOSIS — F419 Anxiety disorder, unspecified: Secondary | ICD-10-CM | POA: Diagnosis not present

## 2022-04-02 DIAGNOSIS — F419 Anxiety disorder, unspecified: Secondary | ICD-10-CM | POA: Diagnosis not present

## 2022-04-02 DIAGNOSIS — F331 Major depressive disorder, recurrent, moderate: Secondary | ICD-10-CM | POA: Diagnosis not present

## 2022-04-03 IMAGING — CT CT ABD-PELV W/ CM
2 of 5 series · 12 of 36 positions shown, 15 images · IV contrast (Omni 300)
Comparison: Same day chest and abdominal radiographs.

CLINICAL DATA: Fall from ladder.  Chest and abdominal trauma

EXAM:
CT CHEST, ABDOMEN, AND PELVIS WITH CONTRAST
TECHNIQUE: Multidetector CT imaging of the chest, abdomen and pelvis was
performed following the standard protocol during bolus
administration of intravenous contrast.
CONTRAST:  100mL OMNIPAQUE IOHEXOL 350 MG/ML SOLN

[Series 5: cap with 5mm st · axial · 0.89mm/px · z∈[-513,+47]mm · 9 of 138 slices shown, 12 images]
[im 13/138  mediastinal]
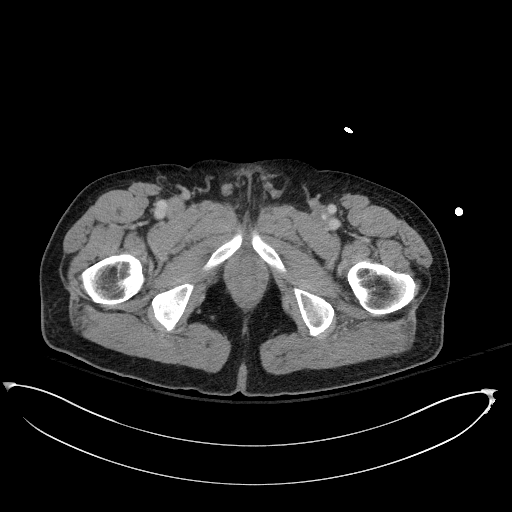
[im 13/138  lung]
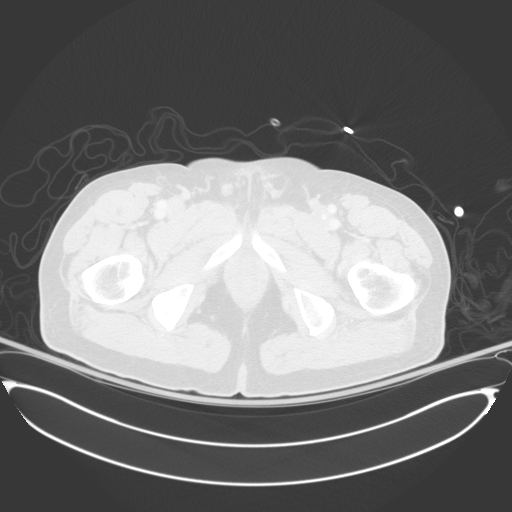
[im 25/138  lung]
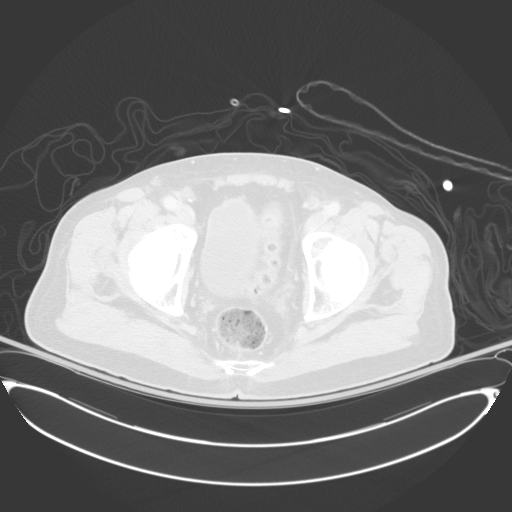
[im 38/138  lung]
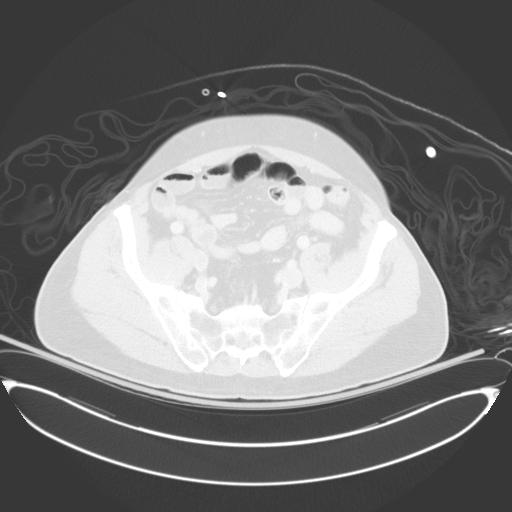
[im 50/138  lung]
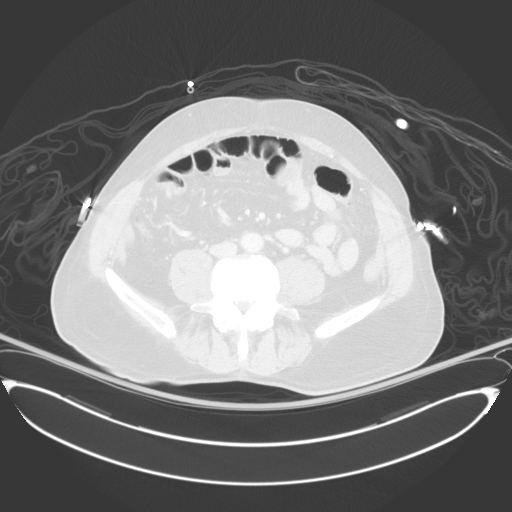
[im 75/138  mediastinal]
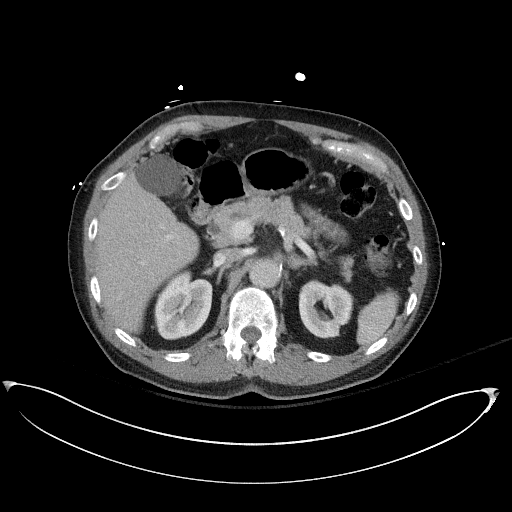
[im 75/138  lung]
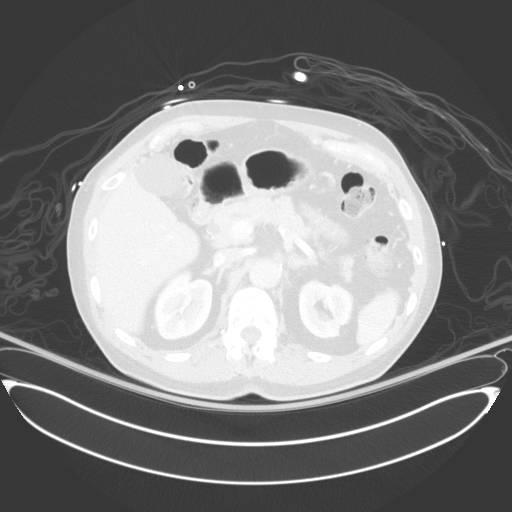
[im 88/138  lung]
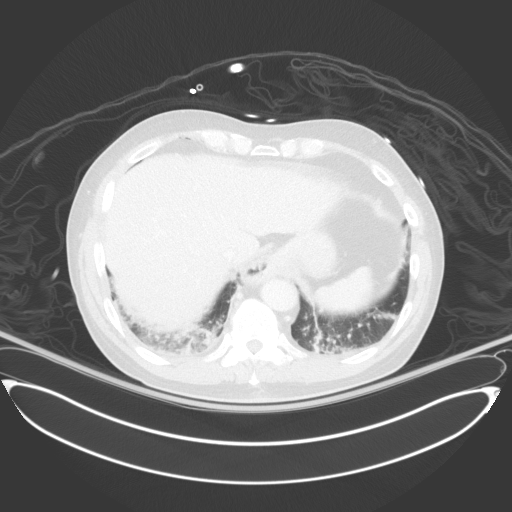
[im 100/138  lung]
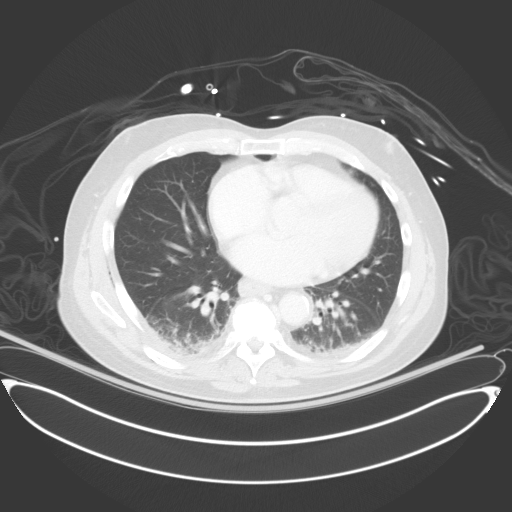
[im 113/138  lung]
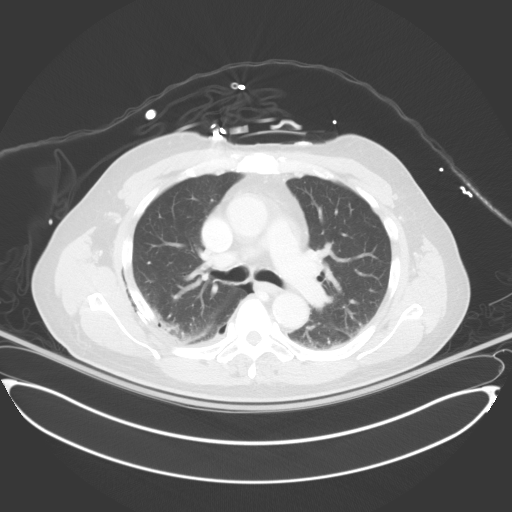
[im 125/138  mediastinal]
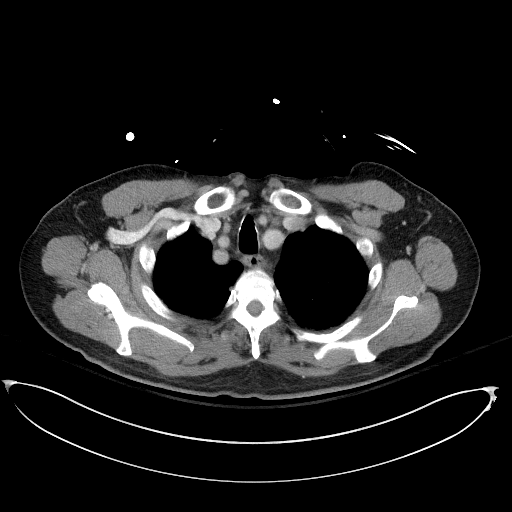
[im 125/138  lung]
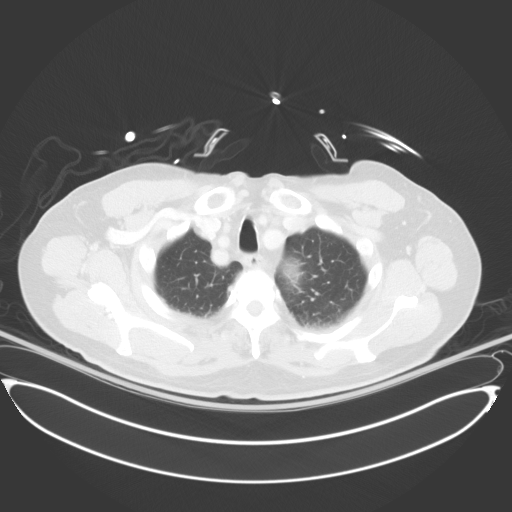

[Series 7: cap with 3mm st cor · coronal · 0.72mm/px · 3 of 133 slices shown]
[im 27/133  lung]
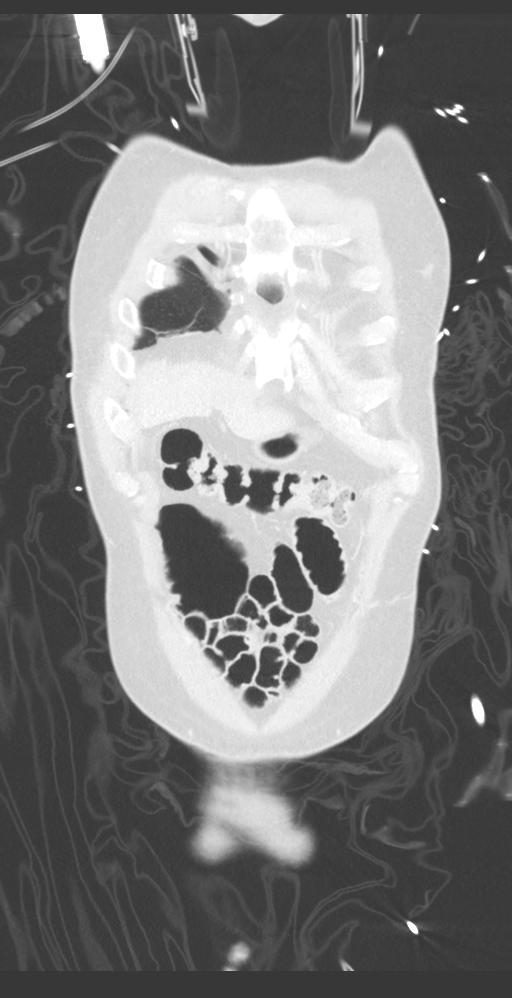
[im 53/133  lung]
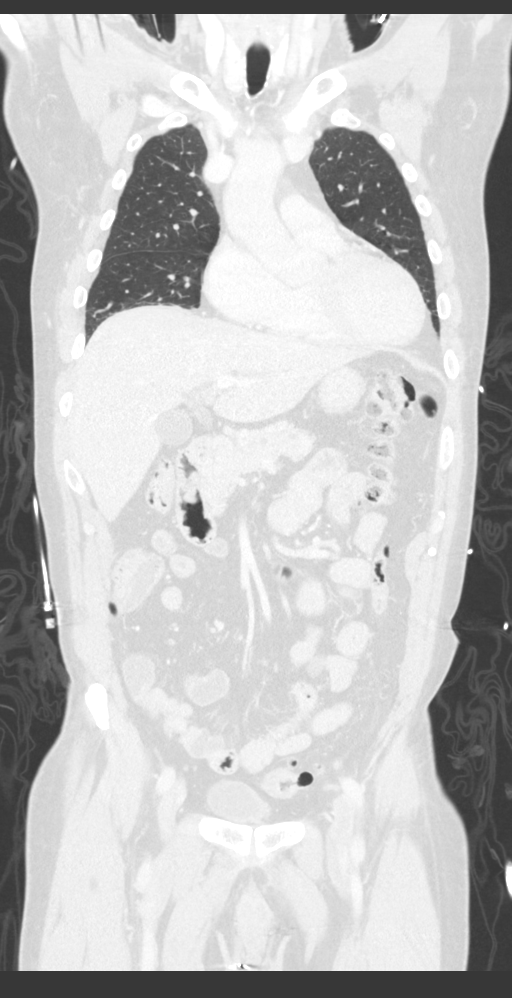
[im 80/133  lung]
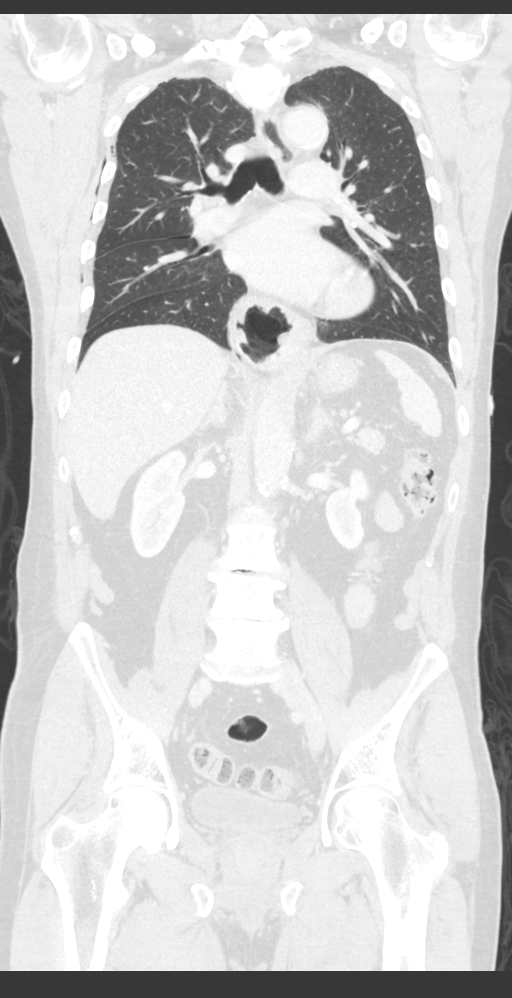

[12 of 36 positions shown; findings below may reference images not displayed]

FINDINGS: CT CHEST FINDINGS

Cardiovascular: Heart size is upper limits of normal. No pericardial
effusion. Thoracic aorta is nonaneurysmal. Scattered atherosclerotic
calcifications of the aorta and coronary arteries. Central pulmonary
vasculature is within normal limits.

Mediastinum/Nodes: Thyroid gland unremarkable. No axillary,
mediastinal, or hilar lymphadenopathy. Moderate-sized hiatal hernia.
Esophagus within normal limits. Unremarkable trachea.

Lungs/Pleura: Dependent subsegmental atelectasis within both lungs.
A component of right-sided pulmonary contusion not excluded. No
pleural effusion. Tiny right-sided pneumothorax. No left-sided
pneumothorax.

Musculoskeletal: Acute moderately displaced fractures of the
posterior right fourth and fifth ribs. Additional nondisplaced
fractures of the right sixth through ninth ribs. Segmental fractures
include the right fourth through eighth ribs. Subcutaneous emphysema
overlies the right lateral chest wall thoracic vertebral body
heights and alignment are maintained without evidence of fracture.
No left-sided rib fractures are identified.

CT ABDOMEN PELVIS FINDINGS

Hepatobiliary: No hepatic injury or perihepatic hematoma. There is a
tiny amount of air along the anterolateral margin of the liver which
is favored to be the inferior aspect of the pleural space.
Gallbladder is unremarkable.

Pancreas: Mild pancreatic ductal dilatation. No focal pancreatic
abnormality. No peripancreatic inflammatory changes.

Spleen: No splenic injury or perisplenic hematoma.

Adrenals/Urinary Tract: No adrenal hemorrhage or renal injury
identified. Bladder is unremarkable.

Stomach/Bowel: Moderate hiatal hernia. Stomach otherwise
unremarkable. No dilated loops of bowel. No focal bowel wall
thickening or inflammatory changes.

Vascular/Lymphatic: Scattered aortoiliac atherosclerotic
calcifications without aneurysm. No abdominopelvic lymphadenopathy.

Reproductive: Prostate is unremarkable.

Other: No free fluid. No abdominopelvic fluid collection. No
pneumoperitoneum. No abdominal wall hernia.

Musculoskeletal: Lumbar vertebral body heights and alignment are
maintained without evidence of fracture. Pelvic bony ring intact
without fracture or diastasis. Bilateral hips are intact without
fracture or dislocation.
IMPRESSION: 1. Acute moderately displaced fractures of the posterior right
fourth and fifth ribs. Additional nondisplaced fractures of the
right sixth through ninth ribs. Segmental fractures include the
right fourth through eighth ribs which raise the possibility of a
flail segment.
2. Tiny right-sided pneumothorax.
3. Dependent subsegmental atelectasis within both lungs. A component
of right-sided pulmonary contusion not excluded.
4. No acute findings within the abdomen or pelvis.
5. Moderate-sized hiatal hernia.

Aortic Atherosclerosis ([id number]-965.5).

## 2022-04-03 IMAGING — CT CT CERVICAL SPINE W/O CM
3 of 4 series · 13 of 33 positions shown, 16 images · non-contrast
Comparison: None.

CLINICAL DATA: Fell off a ladder.

EXAM:
CT HEAD WITHOUT CONTRAST
CT CERVICAL SPINE WITHOUT CONTRAST
TECHNIQUE: Multidetector CT imaging of the head and cervical spine was
performed following the standard protocol without intravenous
contrast. Multiplanar CT image reconstructions of the cervical spine
were also generated.

[Series 7: c_spine 2.0 ax bone · axial · 0.38mm/px · z∈[+381,+509]mm · 5 of 99 slices shown, 7 images]
[im 17/99  soft-tissue]
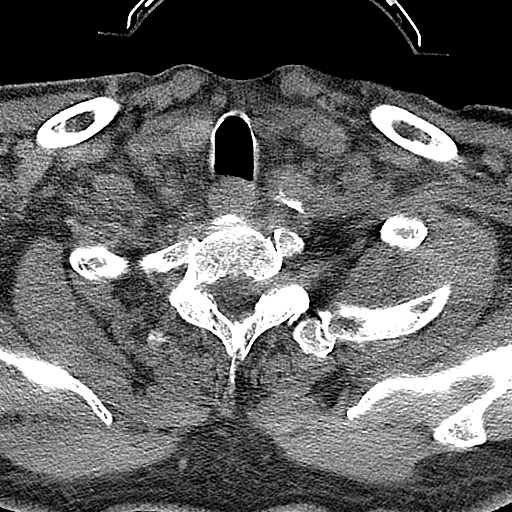
[im 17/99  bone]
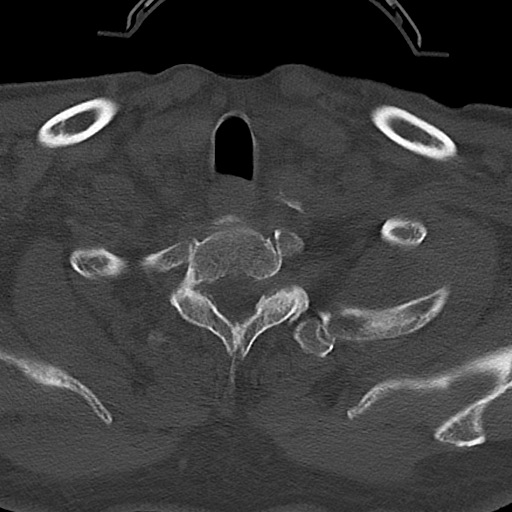
[im 33/99  bone]
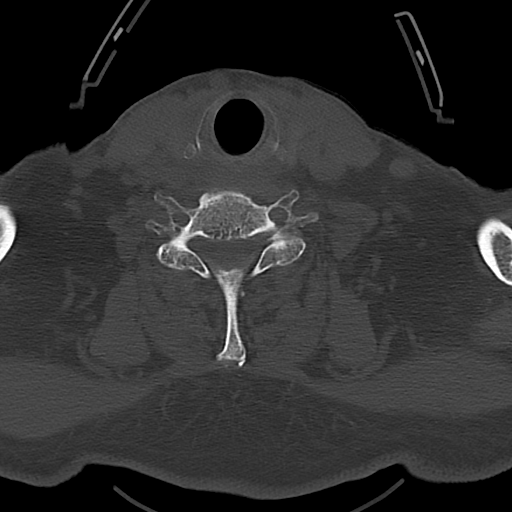
[im 50/99  bone]
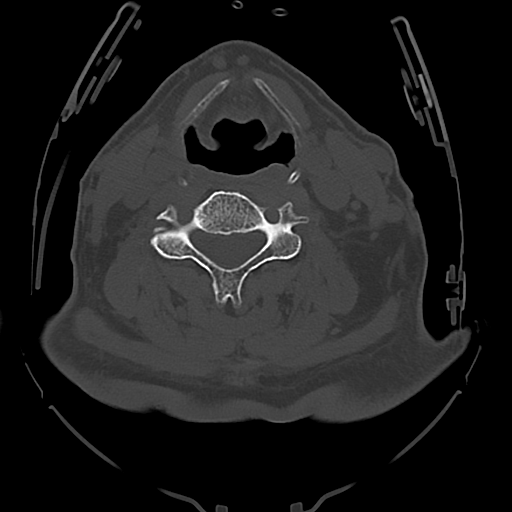
[im 66/99  bone]
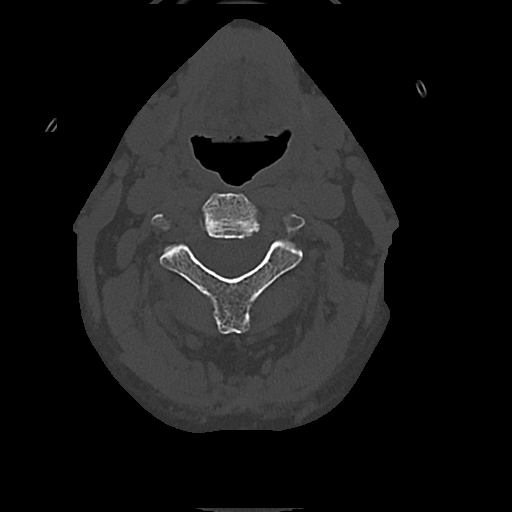
[im 82/99  soft-tissue]
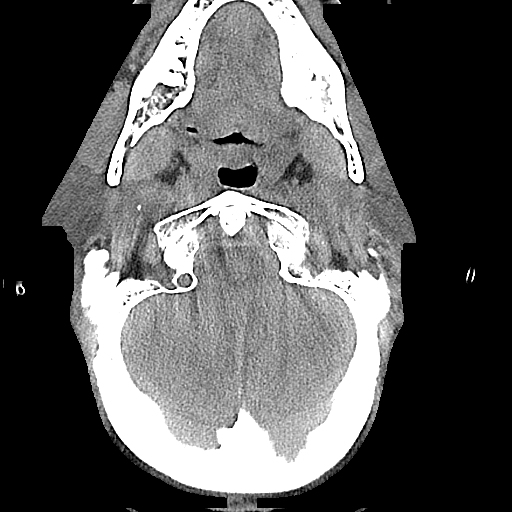
[im 82/99  bone]
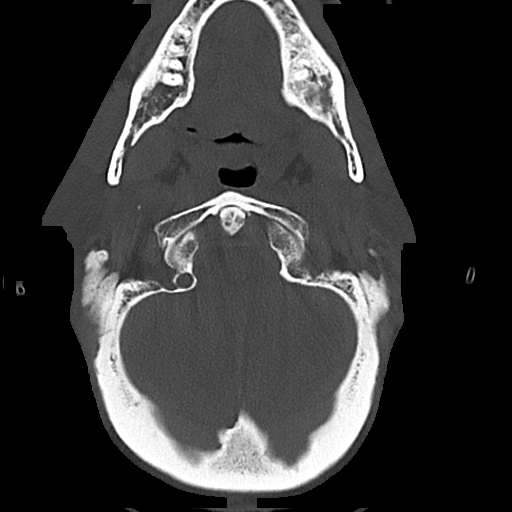

[Series 8: c_spine 2.0 sag bone · sagittal · 0.37mm/px · 5 of 63 slices shown, 6 images]
[im 21/63  bone]
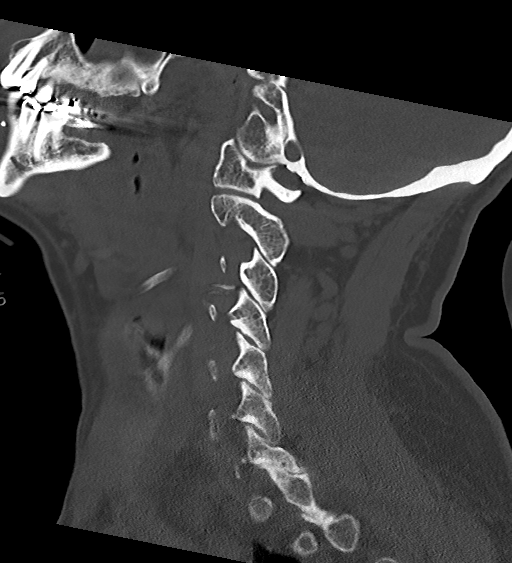
[im 26/63  bone]
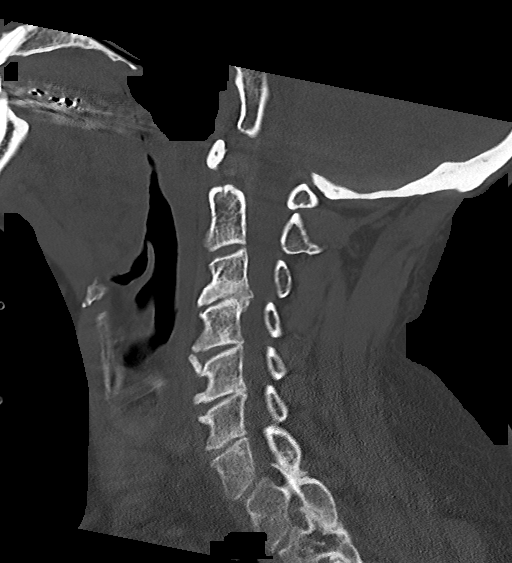
[im 32/63  soft-tissue]
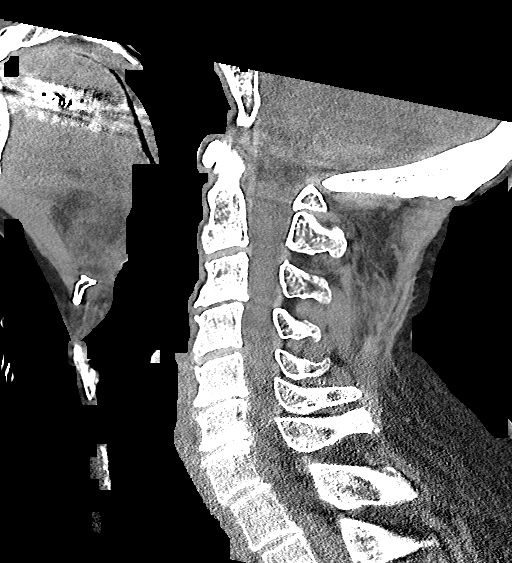
[im 32/63  bone]
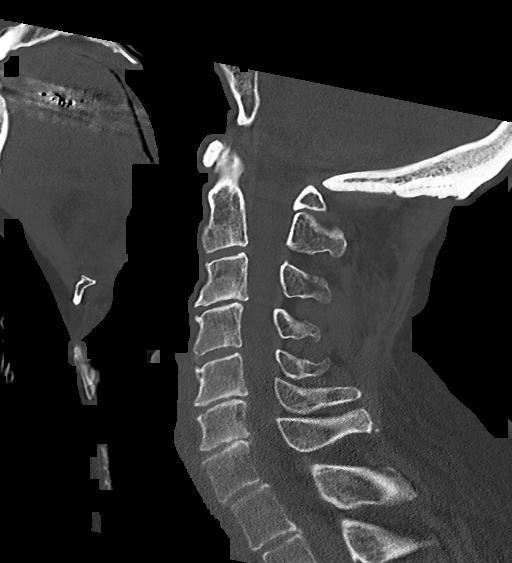
[im 37/63  bone]
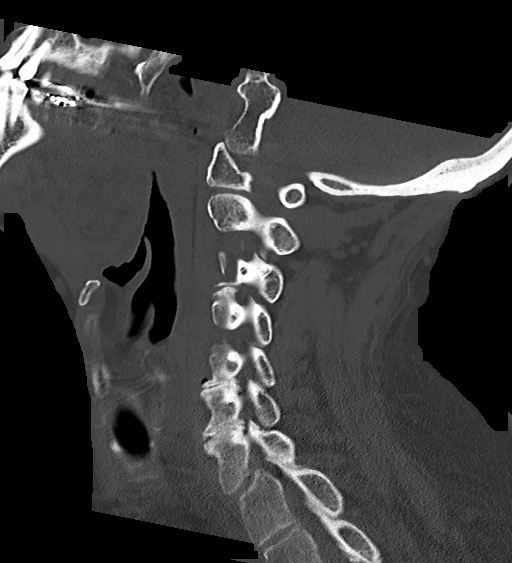
[im 42/63  bone]
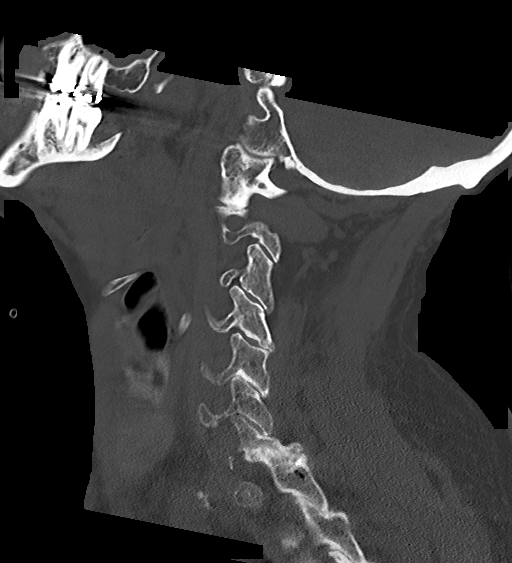

[Series 9: c_spine 2.0 cor bone · coronal · 0.23mm/px · 3 of 69 slices shown]
[im 16/69  bone]
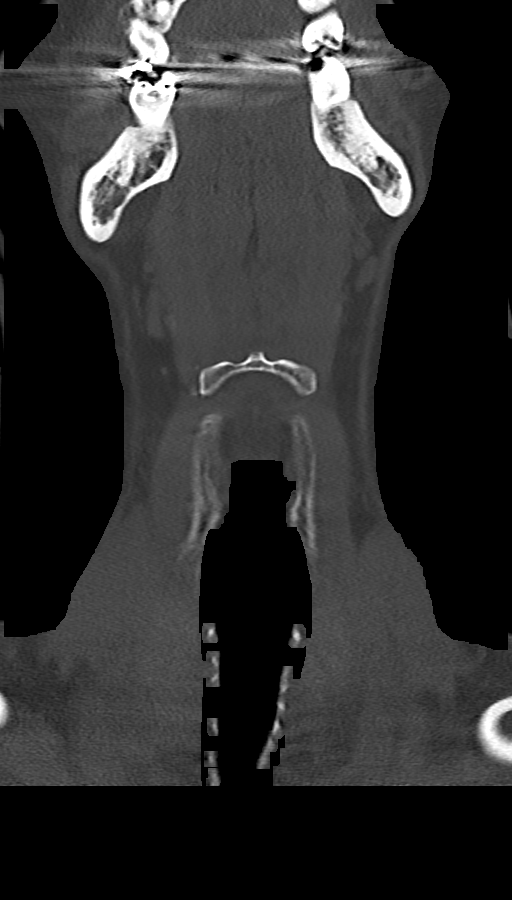
[im 28/69  bone]
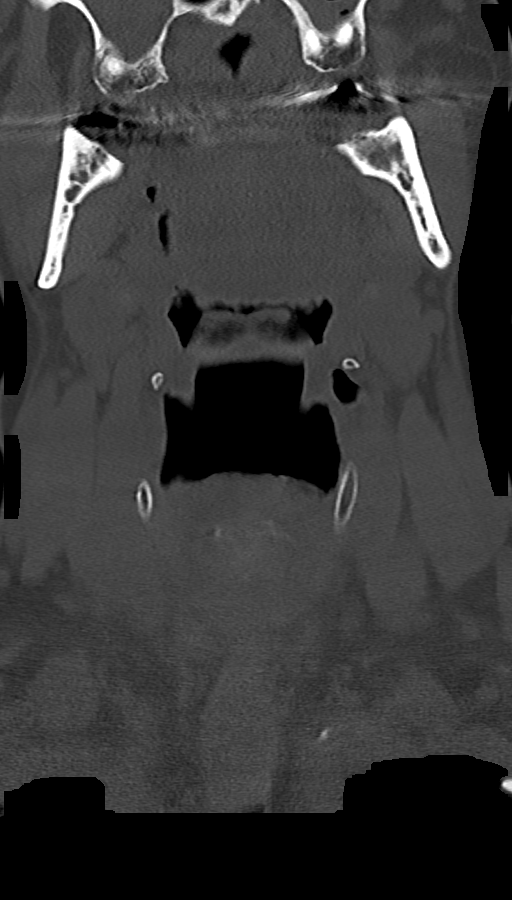
[im 41/69  bone]
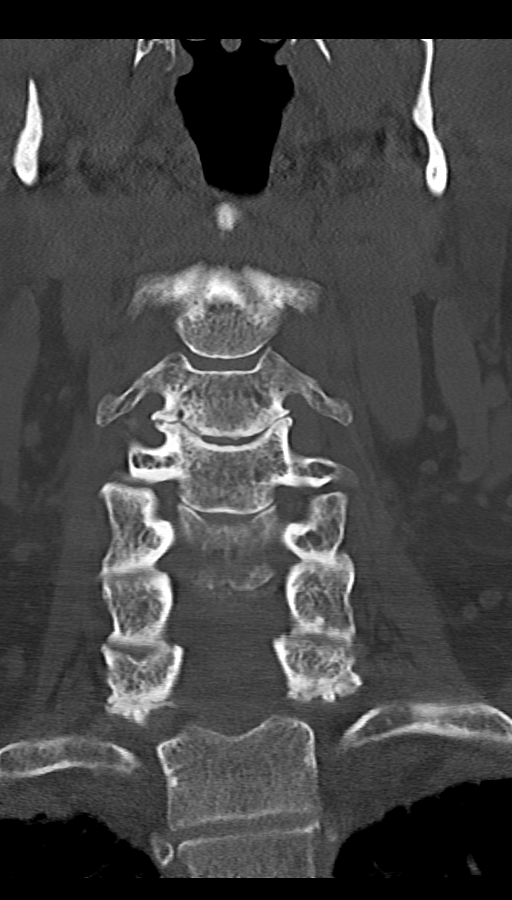

[13 of 33 positions shown; findings below may reference images not displayed]

FINDINGS: CT HEAD FINDINGS

Brain: No evidence of acute infarction, hemorrhage, hydrocephalus,
extra-axial collection or mass lesion/mass effect.

Vascular: Atherosclerotic vascular calcification of the carotid
siphons. No hyperdense vessel.

Skull: Normal. Negative for fracture or focal lesion.

Sinuses/Orbits: Scattered paranasal sinus mucosal thickening.
Bilateral maxillary sinus retention cysts. Right maxillary sinus
air-fluid level.

Other: None.

CT CERVICAL SPINE FINDINGS

Alignment: No traumatic malalignment. Trace retrolisthesis at C3-C4.
Trace anterolisthesis at C7-T1.

Skull base and vertebrae: No acute fracture. No primary bone lesion
or focal pathologic process.

Soft tissues and spinal canal: No prevertebral fluid or swelling. No
visible canal hematoma.

Disc levels: Multilevel disc height loss and uncovertebral
hypertrophy, moderate to severe at C3-C4. Advanced facet arthropathy
at C7-T1.

Upper chest: Please see separate CT chest, abdomen, and pelvis
report from same day.

Other: None.
IMPRESSION: 1. No acute intracranial abnormality.
2. No acute cervical spine fracture or traumatic listhesis.
3. Paranasal sinus disease with right maxillary sinus air-fluid
level. Correlate for acute sinusitis.

These results were discussed in person at the time of interpretation
on 05/27/2021 at [DATE] with provider YUKI FRAME, who verbally
acknowledged these results.

## 2022-04-03 IMAGING — CT CT HEAD W/O CM
4 series · 16 of 47 positions shown, 18 images · non-contrast
Comparison: None.

CLINICAL DATA: Fell off a ladder.

EXAM:
CT HEAD WITHOUT CONTRAST
CT CERVICAL SPINE WITHOUT CONTRAST
TECHNIQUE: Multidetector CT imaging of the head and cervical spine was
performed following the standard protocol without intravenous
contrast. Multiplanar CT image reconstructions of the cervical spine
were also generated.

[Series 4: head without · axial · non-contrast · 0.47mm/px · z∈[+567,+702]mm · 7 of 37 slices shown, 9 images]
[im 5/37  brain]
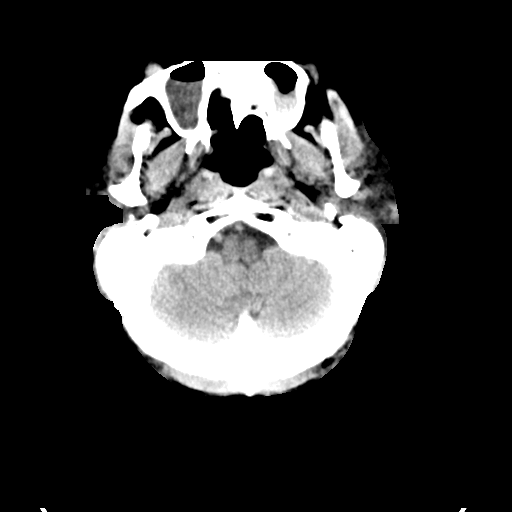
[im 5/37  bone]
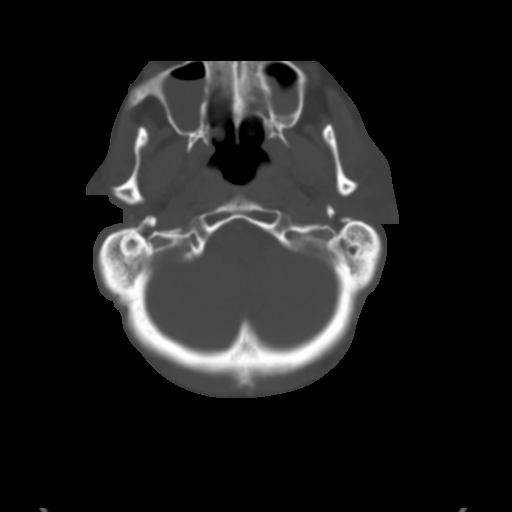
[im 10/37  brain]
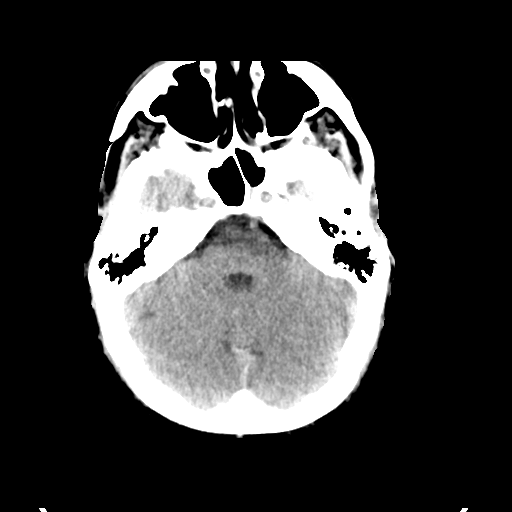
[im 14/37  brain]
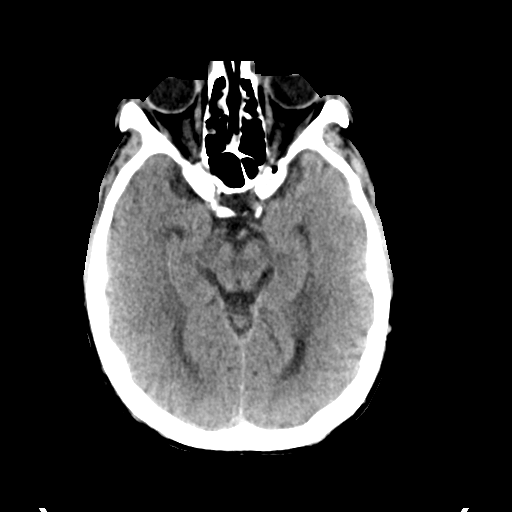
[im 19/37  brain]
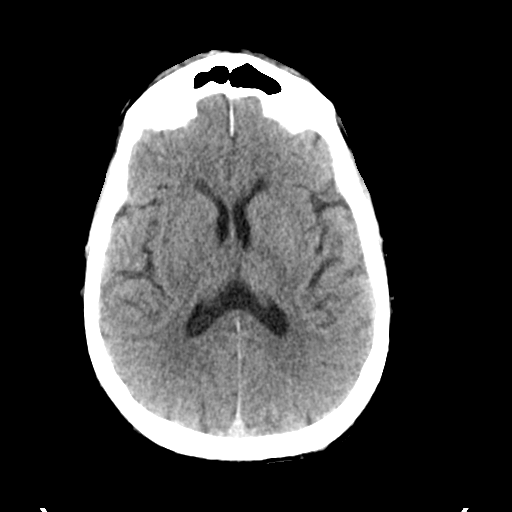
[im 23/37  brain]
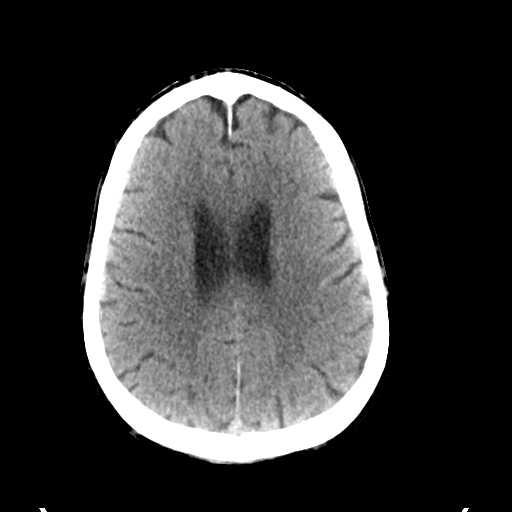
[im 23/37  bone]
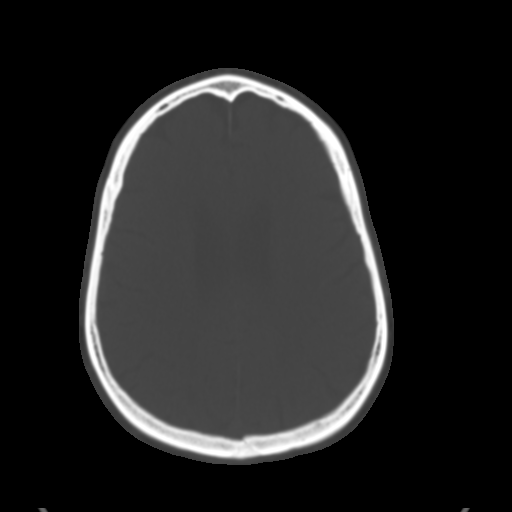
[im 28/37  brain]
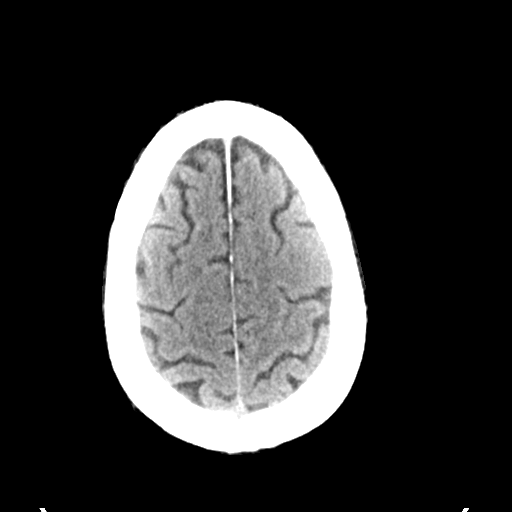
[im 32/37  brain]
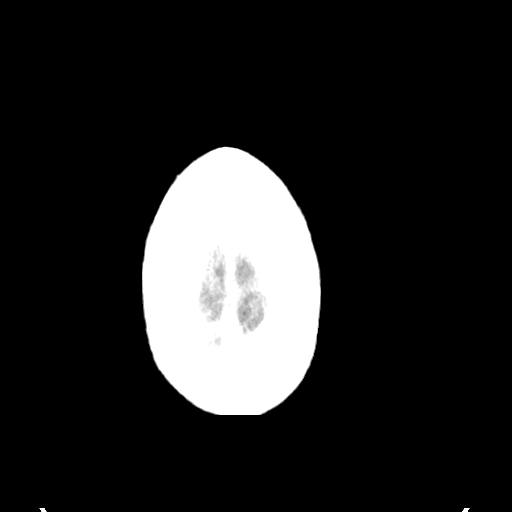

[Series 5: head bone · axial · 0.47mm/px · z∈[+565,+601]mm · 3 of 92 slices shown]
[im 10/92  bone]
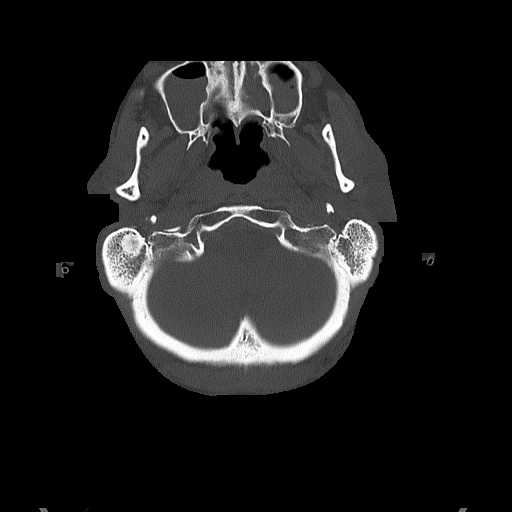
[im 19/92  bone]
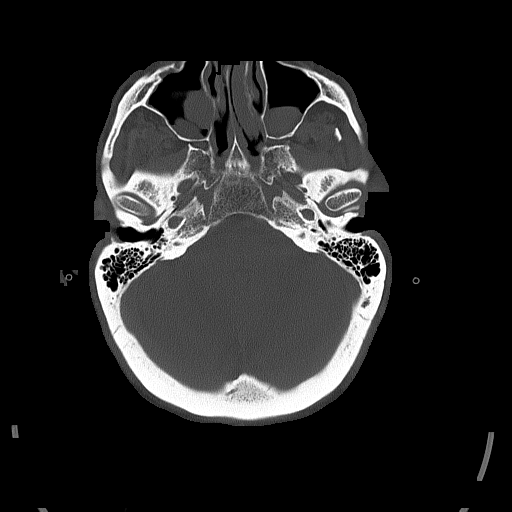
[im 28/92  bone]
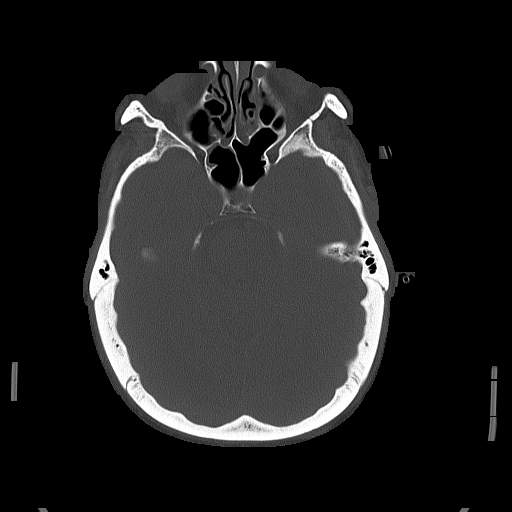

[Series 6: head without cor · coronal · non-contrast · 0.37mm/px · 3 of 66 slices shown]
[im 24/66  brain]
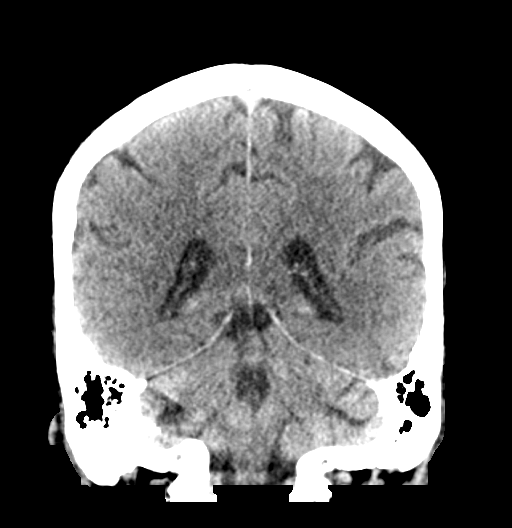
[im 30/66  brain]
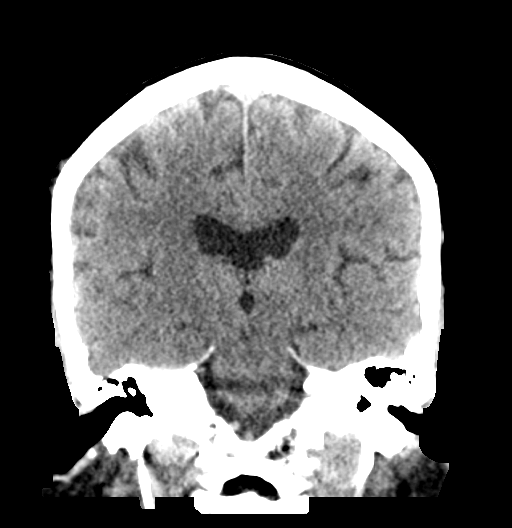
[im 36/66  brain]
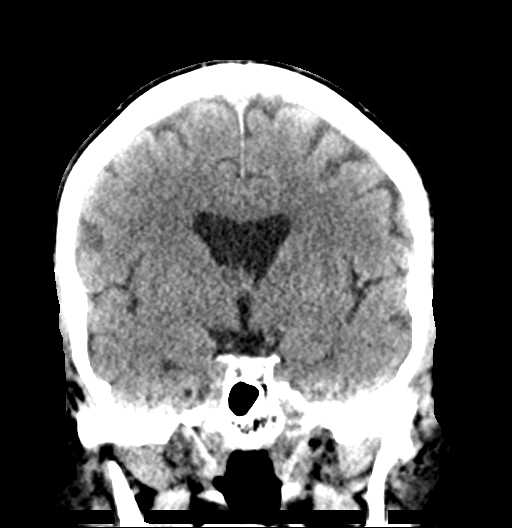

[Series 7: head without sag · sagittal · non-contrast · 0.38mm/px · 3 of 51 slices shown]
[im 17/51  brain]
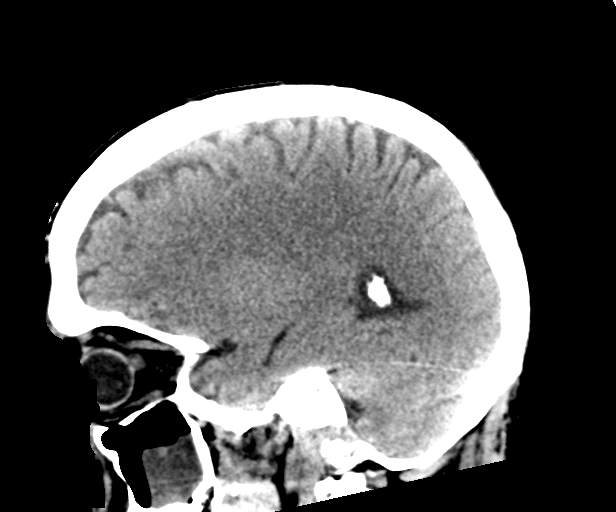
[im 26/51  brain]
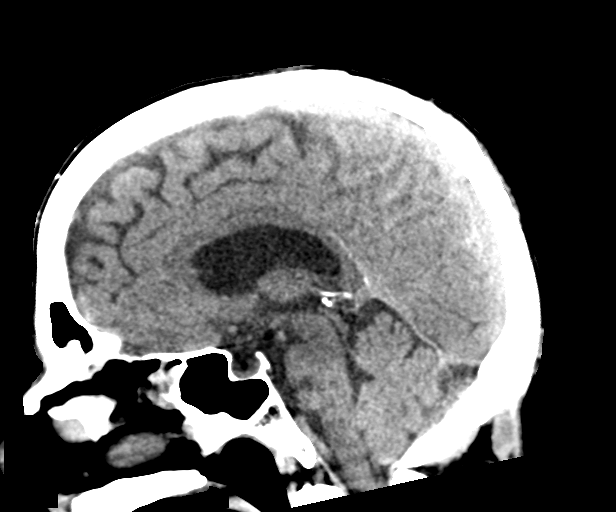
[im 34/51  brain]
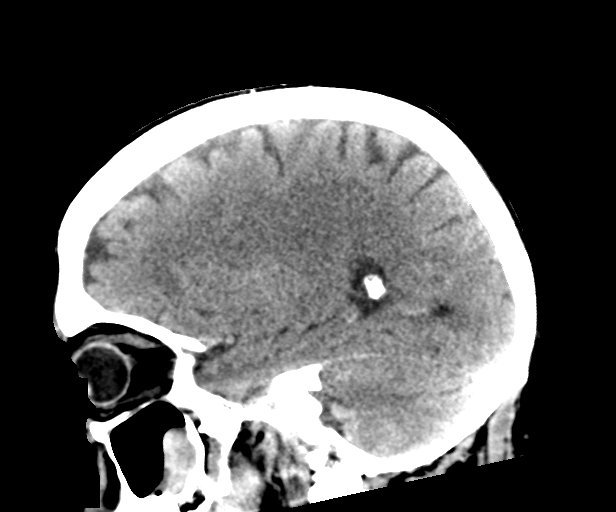

[16 of 47 positions shown; findings below may reference images not displayed]

FINDINGS: CT HEAD FINDINGS

Brain: No evidence of acute infarction, hemorrhage, hydrocephalus,
extra-axial collection or mass lesion/mass effect.

Vascular: Atherosclerotic vascular calcification of the carotid
siphons. No hyperdense vessel.

Skull: Normal. Negative for fracture or focal lesion.

Sinuses/Orbits: Scattered paranasal sinus mucosal thickening.
Bilateral maxillary sinus retention cysts. Right maxillary sinus
air-fluid level.

Other: None.

CT CERVICAL SPINE FINDINGS

Alignment: No traumatic malalignment. Trace retrolisthesis at C3-C4.
Trace anterolisthesis at C7-T1.

Skull base and vertebrae: No acute fracture. No primary bone lesion
or focal pathologic process.

Soft tissues and spinal canal: No prevertebral fluid or swelling. No
visible canal hematoma.

Disc levels: Multilevel disc height loss and uncovertebral
hypertrophy, moderate to severe at C3-C4. Advanced facet arthropathy
at C7-T1.

Upper chest: Please see separate CT chest, abdomen, and pelvis
report from same day.

Other: None.
IMPRESSION: 1. No acute intracranial abnormality.
2. No acute cervical spine fracture or traumatic listhesis.
3. Paranasal sinus disease with right maxillary sinus air-fluid
level. Correlate for acute sinusitis.

These results were discussed in person at the time of interpretation
on 05/27/2021 at [DATE] with provider YUKI FRAME, who verbally
acknowledged these results.

## 2022-04-04 DIAGNOSIS — M545 Low back pain, unspecified: Secondary | ICD-10-CM | POA: Diagnosis not present

## 2022-04-04 IMAGING — DX DG CHEST 1V PORT
1 series · 1 of 1 positions shown · non-contrast
Comparison: Chest radiograph and chest CT dated 1 day prior

CLINICAL DATA: Right pneumothorax

EXAM:
PORTABLE CHEST 1 VIEW

[chest]
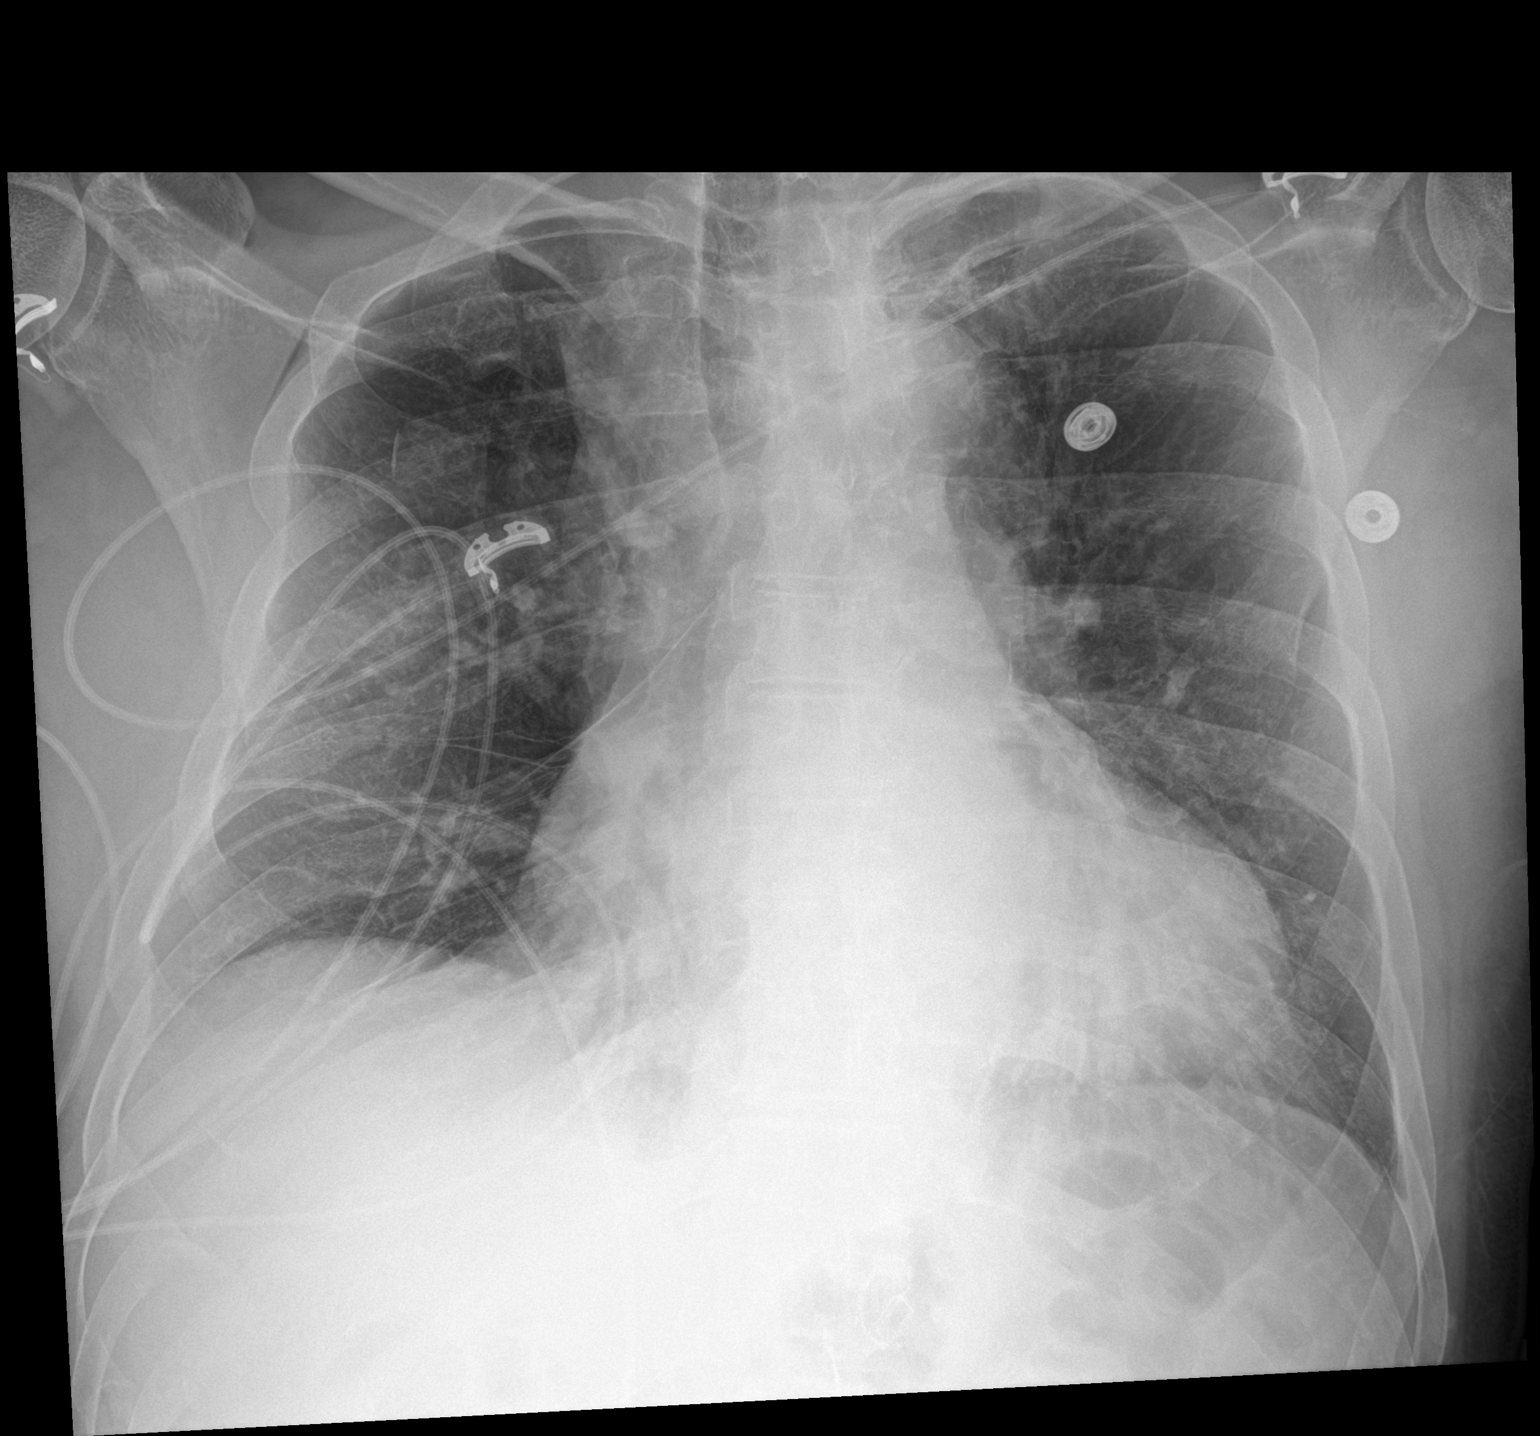

[1 of 1 positions shown; findings below may reference images not displayed]

FINDINGS: The heart is enlarged, unchanged. The mediastinal contours are
stable.

There is no new focal airspace disease. A small amount of lucency is
again seen in the right chest wall lateral to the second and third
ribs. No pneumothorax is seen. There is no left pneumothorax. There
is no pleural effusion.

Displaced fractures of the right fourth and fifth ribs are again
seen. Additional nondisplaced rib fractures are better seen on the
prior CT.
IMPRESSION: Unchanged lucency just lateral to the right second and third ribs.
No pneumothorax identified.

## 2022-04-16 DIAGNOSIS — F419 Anxiety disorder, unspecified: Secondary | ICD-10-CM | POA: Diagnosis not present

## 2022-04-28 DIAGNOSIS — M545 Low back pain, unspecified: Secondary | ICD-10-CM | POA: Diagnosis not present

## 2022-04-30 DIAGNOSIS — F419 Anxiety disorder, unspecified: Secondary | ICD-10-CM | POA: Diagnosis not present

## 2022-05-02 DIAGNOSIS — M545 Low back pain, unspecified: Secondary | ICD-10-CM | POA: Diagnosis not present

## 2022-05-14 DIAGNOSIS — M545 Low back pain, unspecified: Secondary | ICD-10-CM | POA: Diagnosis not present

## 2022-05-28 DIAGNOSIS — F419 Anxiety disorder, unspecified: Secondary | ICD-10-CM | POA: Diagnosis not present

## 2022-05-29 DIAGNOSIS — R197 Diarrhea, unspecified: Secondary | ICD-10-CM | POA: Diagnosis not present

## 2022-05-29 DIAGNOSIS — R739 Hyperglycemia, unspecified: Secondary | ICD-10-CM | POA: Diagnosis not present

## 2022-05-29 DIAGNOSIS — E785 Hyperlipidemia, unspecified: Secondary | ICD-10-CM | POA: Diagnosis not present

## 2022-05-29 DIAGNOSIS — E7800 Pure hypercholesterolemia, unspecified: Secondary | ICD-10-CM | POA: Diagnosis not present

## 2022-05-29 DIAGNOSIS — Z125 Encounter for screening for malignant neoplasm of prostate: Secondary | ICD-10-CM | POA: Diagnosis not present

## 2022-05-29 DIAGNOSIS — Z23 Encounter for immunization: Secondary | ICD-10-CM | POA: Diagnosis not present

## 2022-05-29 DIAGNOSIS — I10 Essential (primary) hypertension: Secondary | ICD-10-CM | POA: Diagnosis not present

## 2022-05-29 DIAGNOSIS — Z0000 Encounter for general adult medical examination without abnormal findings: Secondary | ICD-10-CM | POA: Diagnosis not present

## 2022-05-30 DIAGNOSIS — M545 Low back pain, unspecified: Secondary | ICD-10-CM | POA: Diagnosis not present

## 2022-06-11 DIAGNOSIS — F419 Anxiety disorder, unspecified: Secondary | ICD-10-CM | POA: Diagnosis not present

## 2022-06-16 DIAGNOSIS — M545 Low back pain, unspecified: Secondary | ICD-10-CM | POA: Diagnosis not present

## 2022-06-25 DIAGNOSIS — F419 Anxiety disorder, unspecified: Secondary | ICD-10-CM | POA: Diagnosis not present

## 2022-07-08 DIAGNOSIS — J209 Acute bronchitis, unspecified: Secondary | ICD-10-CM | POA: Diagnosis not present

## 2022-07-08 DIAGNOSIS — Z6824 Body mass index (BMI) 24.0-24.9, adult: Secondary | ICD-10-CM | POA: Diagnosis not present

## 2022-07-09 DIAGNOSIS — F419 Anxiety disorder, unspecified: Secondary | ICD-10-CM | POA: Diagnosis not present

## 2022-07-10 DIAGNOSIS — F331 Major depressive disorder, recurrent, moderate: Secondary | ICD-10-CM | POA: Diagnosis not present

## 2022-07-16 DIAGNOSIS — R55 Syncope and collapse: Secondary | ICD-10-CM | POA: Diagnosis not present

## 2022-07-16 DIAGNOSIS — Z6824 Body mass index (BMI) 24.0-24.9, adult: Secondary | ICD-10-CM | POA: Diagnosis not present

## 2022-07-23 DIAGNOSIS — F419 Anxiety disorder, unspecified: Secondary | ICD-10-CM | POA: Diagnosis not present

## 2022-07-25 DIAGNOSIS — M546 Pain in thoracic spine: Secondary | ICD-10-CM | POA: Diagnosis not present

## 2022-08-13 DIAGNOSIS — F419 Anxiety disorder, unspecified: Secondary | ICD-10-CM | POA: Diagnosis not present

## 2022-08-14 DIAGNOSIS — M546 Pain in thoracic spine: Secondary | ICD-10-CM | POA: Diagnosis not present

## 2022-08-22 DIAGNOSIS — L821 Other seborrheic keratosis: Secondary | ICD-10-CM | POA: Diagnosis not present

## 2022-08-22 DIAGNOSIS — L570 Actinic keratosis: Secondary | ICD-10-CM | POA: Diagnosis not present

## 2022-08-22 DIAGNOSIS — L578 Other skin changes due to chronic exposure to nonionizing radiation: Secondary | ICD-10-CM | POA: Diagnosis not present

## 2022-08-22 DIAGNOSIS — D3610 Benign neoplasm of peripheral nerves and autonomic nervous system, unspecified: Secondary | ICD-10-CM | POA: Diagnosis not present

## 2022-08-22 DIAGNOSIS — D225 Melanocytic nevi of trunk: Secondary | ICD-10-CM | POA: Diagnosis not present

## 2022-09-10 DIAGNOSIS — F419 Anxiety disorder, unspecified: Secondary | ICD-10-CM | POA: Diagnosis not present

## 2022-09-24 DIAGNOSIS — F419 Anxiety disorder, unspecified: Secondary | ICD-10-CM | POA: Diagnosis not present

## 2022-09-30 DIAGNOSIS — M546 Pain in thoracic spine: Secondary | ICD-10-CM | POA: Diagnosis not present

## 2022-10-08 DIAGNOSIS — F419 Anxiety disorder, unspecified: Secondary | ICD-10-CM | POA: Diagnosis not present

## 2022-10-13 DIAGNOSIS — M546 Pain in thoracic spine: Secondary | ICD-10-CM | POA: Diagnosis not present

## 2022-10-21 DIAGNOSIS — M546 Pain in thoracic spine: Secondary | ICD-10-CM | POA: Diagnosis not present

## 2022-10-22 DIAGNOSIS — F331 Major depressive disorder, recurrent, moderate: Secondary | ICD-10-CM | POA: Diagnosis not present

## 2022-11-05 DIAGNOSIS — F419 Anxiety disorder, unspecified: Secondary | ICD-10-CM | POA: Diagnosis not present

## 2022-11-10 DIAGNOSIS — M546 Pain in thoracic spine: Secondary | ICD-10-CM | POA: Diagnosis not present

## 2022-11-19 DIAGNOSIS — F419 Anxiety disorder, unspecified: Secondary | ICD-10-CM | POA: Diagnosis not present

## 2022-11-27 DIAGNOSIS — F3341 Major depressive disorder, recurrent, in partial remission: Secondary | ICD-10-CM | POA: Diagnosis not present

## 2022-11-27 DIAGNOSIS — E7800 Pure hypercholesterolemia, unspecified: Secondary | ICD-10-CM | POA: Diagnosis not present

## 2022-11-27 DIAGNOSIS — Z6825 Body mass index (BMI) 25.0-25.9, adult: Secondary | ICD-10-CM | POA: Diagnosis not present

## 2022-11-27 DIAGNOSIS — R42 Dizziness and giddiness: Secondary | ICD-10-CM | POA: Diagnosis not present

## 2022-11-27 DIAGNOSIS — N529 Male erectile dysfunction, unspecified: Secondary | ICD-10-CM | POA: Diagnosis not present

## 2022-11-27 DIAGNOSIS — I10 Essential (primary) hypertension: Secondary | ICD-10-CM | POA: Diagnosis not present

## 2022-11-27 DIAGNOSIS — E785 Hyperlipidemia, unspecified: Secondary | ICD-10-CM | POA: Diagnosis not present

## 2022-11-28 DIAGNOSIS — M546 Pain in thoracic spine: Secondary | ICD-10-CM | POA: Diagnosis not present

## 2022-12-03 DIAGNOSIS — F419 Anxiety disorder, unspecified: Secondary | ICD-10-CM | POA: Diagnosis not present

## 2022-12-08 DIAGNOSIS — H2513 Age-related nuclear cataract, bilateral: Secondary | ICD-10-CM | POA: Diagnosis not present

## 2022-12-17 DIAGNOSIS — F419 Anxiety disorder, unspecified: Secondary | ICD-10-CM | POA: Diagnosis not present

## 2023-01-14 DIAGNOSIS — F419 Anxiety disorder, unspecified: Secondary | ICD-10-CM | POA: Diagnosis not present

## 2023-02-03 DIAGNOSIS — R748 Abnormal levels of other serum enzymes: Secondary | ICD-10-CM | POA: Diagnosis not present

## 2023-02-03 DIAGNOSIS — I10 Essential (primary) hypertension: Secondary | ICD-10-CM | POA: Diagnosis not present

## 2023-02-03 DIAGNOSIS — E7800 Pure hypercholesterolemia, unspecified: Secondary | ICD-10-CM | POA: Diagnosis not present

## 2023-02-25 DIAGNOSIS — F419 Anxiety disorder, unspecified: Secondary | ICD-10-CM | POA: Diagnosis not present

## 2023-03-19 DIAGNOSIS — M546 Pain in thoracic spine: Secondary | ICD-10-CM | POA: Diagnosis not present

## 2023-03-25 DIAGNOSIS — F419 Anxiety disorder, unspecified: Secondary | ICD-10-CM | POA: Diagnosis not present

## 2023-04-02 DIAGNOSIS — M546 Pain in thoracic spine: Secondary | ICD-10-CM | POA: Diagnosis not present

## 2023-04-03 DIAGNOSIS — Z6824 Body mass index (BMI) 24.0-24.9, adult: Secondary | ICD-10-CM | POA: Diagnosis not present

## 2023-04-03 DIAGNOSIS — A6000 Herpesviral infection of urogenital system, unspecified: Secondary | ICD-10-CM | POA: Diagnosis not present

## 2023-04-03 DIAGNOSIS — Z113 Encounter for screening for infections with a predominantly sexual mode of transmission: Secondary | ICD-10-CM | POA: Diagnosis not present

## 2023-04-15 DIAGNOSIS — F419 Anxiety disorder, unspecified: Secondary | ICD-10-CM | POA: Diagnosis not present

## 2023-04-29 DIAGNOSIS — M546 Pain in thoracic spine: Secondary | ICD-10-CM | POA: Diagnosis not present

## 2023-04-29 DIAGNOSIS — F419 Anxiety disorder, unspecified: Secondary | ICD-10-CM | POA: Diagnosis not present

## 2023-04-30 DIAGNOSIS — F325 Major depressive disorder, single episode, in full remission: Secondary | ICD-10-CM | POA: Diagnosis not present

## 2023-05-13 DIAGNOSIS — F419 Anxiety disorder, unspecified: Secondary | ICD-10-CM | POA: Diagnosis not present

## 2023-05-25 DIAGNOSIS — M546 Pain in thoracic spine: Secondary | ICD-10-CM | POA: Diagnosis not present

## 2023-05-27 DIAGNOSIS — F419 Anxiety disorder, unspecified: Secondary | ICD-10-CM | POA: Diagnosis not present

## 2023-05-28 DIAGNOSIS — T1510XA Foreign body in conjunctival sac, unspecified eye, initial encounter: Secondary | ICD-10-CM | POA: Diagnosis not present

## 2023-06-08 DIAGNOSIS — M546 Pain in thoracic spine: Secondary | ICD-10-CM | POA: Diagnosis not present

## 2023-06-10 DIAGNOSIS — F419 Anxiety disorder, unspecified: Secondary | ICD-10-CM | POA: Diagnosis not present

## 2023-06-23 DIAGNOSIS — M546 Pain in thoracic spine: Secondary | ICD-10-CM | POA: Diagnosis not present

## 2023-06-24 DIAGNOSIS — F419 Anxiety disorder, unspecified: Secondary | ICD-10-CM | POA: Diagnosis not present

## 2023-07-15 DIAGNOSIS — F419 Anxiety disorder, unspecified: Secondary | ICD-10-CM | POA: Diagnosis not present

## 2023-07-15 DIAGNOSIS — M546 Pain in thoracic spine: Secondary | ICD-10-CM | POA: Diagnosis not present

## 2023-07-29 DIAGNOSIS — F325 Major depressive disorder, single episode, in full remission: Secondary | ICD-10-CM | POA: Diagnosis not present

## 2023-08-19 DIAGNOSIS — I10 Essential (primary) hypertension: Secondary | ICD-10-CM | POA: Diagnosis not present

## 2023-08-19 DIAGNOSIS — Z23 Encounter for immunization: Secondary | ICD-10-CM | POA: Diagnosis not present

## 2023-08-19 DIAGNOSIS — E7800 Pure hypercholesterolemia, unspecified: Secondary | ICD-10-CM | POA: Diagnosis not present

## 2023-08-19 DIAGNOSIS — E538 Deficiency of other specified B group vitamins: Secondary | ICD-10-CM | POA: Diagnosis not present

## 2023-08-19 DIAGNOSIS — R739 Hyperglycemia, unspecified: Secondary | ICD-10-CM | POA: Diagnosis not present

## 2023-08-19 DIAGNOSIS — Z0000 Encounter for general adult medical examination without abnormal findings: Secondary | ICD-10-CM | POA: Diagnosis not present

## 2023-08-19 DIAGNOSIS — Z713 Dietary counseling and surveillance: Secondary | ICD-10-CM | POA: Diagnosis not present

## 2023-09-03 DIAGNOSIS — L578 Other skin changes due to chronic exposure to nonionizing radiation: Secondary | ICD-10-CM | POA: Diagnosis not present

## 2023-09-03 DIAGNOSIS — L821 Other seborrheic keratosis: Secondary | ICD-10-CM | POA: Diagnosis not present

## 2023-09-03 DIAGNOSIS — L570 Actinic keratosis: Secondary | ICD-10-CM | POA: Diagnosis not present

## 2023-09-03 DIAGNOSIS — D225 Melanocytic nevi of trunk: Secondary | ICD-10-CM | POA: Diagnosis not present

## 2023-09-03 DIAGNOSIS — D3610 Benign neoplasm of peripheral nerves and autonomic nervous system, unspecified: Secondary | ICD-10-CM | POA: Diagnosis not present

## 2023-09-18 DIAGNOSIS — R748 Abnormal levels of other serum enzymes: Secondary | ICD-10-CM | POA: Diagnosis not present

## 2023-10-19 DIAGNOSIS — M546 Pain in thoracic spine: Secondary | ICD-10-CM | POA: Diagnosis not present

## 2023-10-27 DIAGNOSIS — M546 Pain in thoracic spine: Secondary | ICD-10-CM | POA: Diagnosis not present

## 2023-11-11 DIAGNOSIS — M546 Pain in thoracic spine: Secondary | ICD-10-CM | POA: Diagnosis not present

## 2023-12-09 DIAGNOSIS — M546 Pain in thoracic spine: Secondary | ICD-10-CM | POA: Diagnosis not present

## 2023-12-29 DIAGNOSIS — M546 Pain in thoracic spine: Secondary | ICD-10-CM | POA: Diagnosis not present

## 2023-12-30 DIAGNOSIS — M546 Pain in thoracic spine: Secondary | ICD-10-CM | POA: Diagnosis not present

## 2024-01-09 DIAGNOSIS — E785 Hyperlipidemia, unspecified: Secondary | ICD-10-CM | POA: Diagnosis not present

## 2024-01-09 DIAGNOSIS — E7800 Pure hypercholesterolemia, unspecified: Secondary | ICD-10-CM | POA: Diagnosis not present

## 2024-01-12 DIAGNOSIS — I10 Essential (primary) hypertension: Secondary | ICD-10-CM | POA: Diagnosis not present

## 2024-02-10 DIAGNOSIS — I10 Essential (primary) hypertension: Secondary | ICD-10-CM | POA: Diagnosis not present

## 2024-02-15 DIAGNOSIS — H2513 Age-related nuclear cataract, bilateral: Secondary | ICD-10-CM | POA: Diagnosis not present

## 2024-03-10 DIAGNOSIS — E7800 Pure hypercholesterolemia, unspecified: Secondary | ICD-10-CM | POA: Diagnosis not present

## 2024-03-10 DIAGNOSIS — I10 Essential (primary) hypertension: Secondary | ICD-10-CM | POA: Diagnosis not present

## 2024-03-10 DIAGNOSIS — E785 Hyperlipidemia, unspecified: Secondary | ICD-10-CM | POA: Diagnosis not present

## 2024-03-10 DIAGNOSIS — H04123 Dry eye syndrome of bilateral lacrimal glands: Secondary | ICD-10-CM | POA: Diagnosis not present

## 2024-03-11 DIAGNOSIS — I10 Essential (primary) hypertension: Secondary | ICD-10-CM | POA: Diagnosis not present

## 2024-03-31 DIAGNOSIS — M546 Pain in thoracic spine: Secondary | ICD-10-CM | POA: Diagnosis not present

## 2024-04-10 DIAGNOSIS — E785 Hyperlipidemia, unspecified: Secondary | ICD-10-CM | POA: Diagnosis not present

## 2024-04-10 DIAGNOSIS — I10 Essential (primary) hypertension: Secondary | ICD-10-CM | POA: Diagnosis not present

## 2024-04-10 DIAGNOSIS — E7800 Pure hypercholesterolemia, unspecified: Secondary | ICD-10-CM | POA: Diagnosis not present

## 2024-04-14 DIAGNOSIS — H04123 Dry eye syndrome of bilateral lacrimal glands: Secondary | ICD-10-CM | POA: Diagnosis not present

## 2024-05-10 DIAGNOSIS — E7800 Pure hypercholesterolemia, unspecified: Secondary | ICD-10-CM | POA: Diagnosis not present

## 2024-05-10 DIAGNOSIS — I10 Essential (primary) hypertension: Secondary | ICD-10-CM | POA: Diagnosis not present

## 2024-05-10 DIAGNOSIS — E785 Hyperlipidemia, unspecified: Secondary | ICD-10-CM | POA: Diagnosis not present

## 2024-06-08 DIAGNOSIS — Z1283 Encounter for screening for malignant neoplasm of skin: Secondary | ICD-10-CM | POA: Diagnosis not present

## 2024-06-08 DIAGNOSIS — E785 Hyperlipidemia, unspecified: Secondary | ICD-10-CM | POA: Diagnosis not present

## 2024-06-08 DIAGNOSIS — Z6824 Body mass index (BMI) 24.0-24.9, adult: Secondary | ICD-10-CM | POA: Diagnosis not present

## 2024-06-08 DIAGNOSIS — R55 Syncope and collapse: Secondary | ICD-10-CM | POA: Diagnosis not present

## 2024-06-08 DIAGNOSIS — Z131 Encounter for screening for diabetes mellitus: Secondary | ICD-10-CM | POA: Diagnosis not present

## 2024-06-08 DIAGNOSIS — Z0000 Encounter for general adult medical examination without abnormal findings: Secondary | ICD-10-CM | POA: Diagnosis not present

## 2024-06-08 DIAGNOSIS — M25561 Pain in right knee: Secondary | ICD-10-CM | POA: Diagnosis not present

## 2024-06-08 DIAGNOSIS — Z23 Encounter for immunization: Secondary | ICD-10-CM | POA: Diagnosis not present
